# Patient Record
Sex: Female | Born: 1980 | Race: White | Hispanic: No | Marital: Married | Smoking: Never smoker
Health system: Southern US, Community
[De-identification: ages and names within clinical notes are randomized; demographics above are authoritative.]

## PROBLEM LIST (undated history)

## (undated) DIAGNOSIS — F329 Major depressive disorder, single episode, unspecified: Secondary | ICD-10-CM

## (undated) DIAGNOSIS — F419 Anxiety disorder, unspecified: Secondary | ICD-10-CM

## (undated) DIAGNOSIS — F32A Depression, unspecified: Secondary | ICD-10-CM

## (undated) DIAGNOSIS — Z5189 Encounter for other specified aftercare: Secondary | ICD-10-CM

## (undated) DIAGNOSIS — D649 Anemia, unspecified: Secondary | ICD-10-CM

## (undated) HISTORY — PX: NO PAST SURGERIES: SHX2092

---

## 2009-04-15 ENCOUNTER — Ambulatory Visit: Payer: Self-pay | Admitting: Obstetrics and Gynecology

## 2009-05-13 ENCOUNTER — Ambulatory Visit: Payer: Self-pay | Admitting: Physician Assistant

## 2009-06-03 ENCOUNTER — Ambulatory Visit (HOSPITAL_COMMUNITY): Admission: RE | Admit: 2009-06-03 | Discharge: 2009-06-03 | Payer: Self-pay | Admitting: Obstetrics & Gynecology

## 2009-06-10 ENCOUNTER — Ambulatory Visit: Payer: Self-pay | Admitting: Obstetrics and Gynecology

## 2009-07-01 ENCOUNTER — Ambulatory Visit: Payer: Self-pay | Admitting: Family

## 2009-07-19 ENCOUNTER — Ambulatory Visit: Payer: Self-pay | Admitting: Obstetrics & Gynecology

## 2009-08-11 ENCOUNTER — Ambulatory Visit (HOSPITAL_COMMUNITY): Admission: RE | Admit: 2009-08-11 | Discharge: 2009-08-11 | Payer: Self-pay | Admitting: Obstetrics & Gynecology

## 2009-08-12 ENCOUNTER — Ambulatory Visit: Payer: Self-pay | Admitting: Physician Assistant

## 2009-08-12 ENCOUNTER — Encounter: Payer: Self-pay | Admitting: Obstetrics and Gynecology

## 2009-08-12 LAB — CONVERTED CEMR LAB
HCT: 36.4 % (ref 36.0–46.0)
MCHC: 31.9 g/dL (ref 30.0–36.0)
Platelets: 129 10*3/uL — ABNORMAL LOW (ref 150–400)
RBC: 3.94 M/uL (ref 3.87–5.11)
RDW: 13.1 % (ref 11.5–15.5)
WBC: 7.9 10*3/uL (ref 4.0–10.5)

## 2009-08-15 ENCOUNTER — Encounter: Payer: Self-pay | Admitting: Obstetrics and Gynecology

## 2009-08-15 LAB — CONVERTED CEMR LAB
Albumin: 4 g/dL (ref 3.5–5.2)
Alkaline Phosphatase: 198 units/L — ABNORMAL HIGH (ref 39–117)
Sodium: 137 meq/L (ref 135–145)

## 2009-08-16 ENCOUNTER — Encounter: Payer: Self-pay | Admitting: Physician Assistant

## 2009-08-16 LAB — CONVERTED CEMR LAB
Creatinine 24 HR UR: 1422 mg/24hr (ref 700–1800)
Creatinine, Urine: 118.5 mg/dL

## 2009-08-29 ENCOUNTER — Ambulatory Visit: Payer: Self-pay | Admitting: Advanced Practice Midwife

## 2009-09-16 ENCOUNTER — Ambulatory Visit: Payer: Self-pay | Admitting: Obstetrics and Gynecology

## 2009-10-03 ENCOUNTER — Ambulatory Visit: Payer: Self-pay | Admitting: Physician Assistant

## 2009-10-04 ENCOUNTER — Encounter: Payer: Self-pay | Admitting: Physician Assistant

## 2009-10-04 LAB — CONVERTED CEMR LAB
Chlamydia, Swab/Urine, PCR: NEGATIVE
GC Probe Amp, Urine: NEGATIVE

## 2009-10-14 ENCOUNTER — Ambulatory Visit (HOSPITAL_COMMUNITY): Admission: RE | Admit: 2009-10-14 | Discharge: 2009-10-14 | Payer: Self-pay | Admitting: Family Medicine

## 2009-10-14 ENCOUNTER — Ambulatory Visit: Payer: Self-pay | Admitting: Obstetrics and Gynecology

## 2009-10-18 ENCOUNTER — Ambulatory Visit: Payer: Self-pay | Admitting: Obstetrics & Gynecology

## 2009-10-21 ENCOUNTER — Ambulatory Visit: Payer: Self-pay | Admitting: Obstetrics and Gynecology

## 2009-10-25 ENCOUNTER — Ambulatory Visit: Payer: Self-pay | Admitting: Obstetrics & Gynecology

## 2009-10-28 ENCOUNTER — Ambulatory Visit: Payer: Self-pay | Admitting: Obstetrics and Gynecology

## 2009-10-28 LAB — CONVERTED CEMR LAB
ALT: 27 units/L (ref 0–35)
AST: 33 units/L (ref 0–37)
Albumin: 3.4 g/dL — ABNORMAL LOW (ref 3.5–5.2)
Alkaline Phosphatase: 581 units/L — ABNORMAL HIGH (ref 39–117)
BUN: 9 mg/dL (ref 6–23)
CO2: 24 meq/L (ref 19–32)
Calcium: 8.6 mg/dL (ref 8.4–10.5)
Creatinine, Ser: 0.6 mg/dL (ref 0.40–1.20)
Glucose, Bld: 99 mg/dL (ref 70–99)
Hemoglobin: 12.9 g/dL (ref 12.0–15.0)
MCHC: 35.1 g/dL (ref 30.0–36.0)
RDW: 13.1 % (ref 11.5–15.5)
Sodium: 136 meq/L (ref 135–145)
Total Protein: 6.5 g/dL (ref 6.0–8.3)

## 2009-11-01 ENCOUNTER — Ambulatory Visit: Payer: Self-pay | Admitting: Advanced Practice Midwife

## 2009-11-01 ENCOUNTER — Inpatient Hospital Stay (HOSPITAL_COMMUNITY): Admission: AD | Admit: 2009-11-01 | Discharge: 2009-11-04 | Payer: Self-pay | Admitting: Obstetrics & Gynecology

## 2009-11-05 ENCOUNTER — Inpatient Hospital Stay (HOSPITAL_COMMUNITY): Admission: AD | Admit: 2009-11-05 | Discharge: 2009-11-05 | Payer: Self-pay | Admitting: Obstetrics & Gynecology

## 2009-11-05 ENCOUNTER — Ambulatory Visit: Payer: Self-pay | Admitting: Advanced Practice Midwife

## 2009-11-05 ENCOUNTER — Encounter: Admission: RE | Admit: 2009-11-05 | Discharge: 2009-12-05 | Payer: Self-pay | Admitting: Family Medicine

## 2009-11-11 ENCOUNTER — Ambulatory Visit: Payer: Self-pay | Admitting: Physician Assistant

## 2009-11-11 LAB — CONVERTED CEMR LAB: RDW: 14.5 % (ref 11.5–15.5)

## 2009-11-14 ENCOUNTER — Ambulatory Visit: Admission: RE | Admit: 2009-11-14 | Discharge: 2009-11-14 | Payer: Self-pay | Admitting: Family Medicine

## 2009-11-17 ENCOUNTER — Ambulatory Visit: Admission: RE | Admit: 2009-11-17 | Discharge: 2009-11-17 | Payer: Self-pay | Admitting: Family Medicine

## 2009-12-09 ENCOUNTER — Ambulatory Visit: Payer: Self-pay | Admitting: Obstetrics and Gynecology

## 2010-01-03 ENCOUNTER — Encounter: Admission: RE | Admit: 2010-01-03 | Discharge: 2010-02-02 | Payer: Self-pay | Admitting: Family Medicine

## 2010-01-17 ENCOUNTER — Ambulatory Visit: Payer: Self-pay | Admitting: Obstetrics & Gynecology

## 2010-01-18 ENCOUNTER — Encounter: Payer: Self-pay | Admitting: Obstetrics & Gynecology

## 2010-01-18 LAB — CONVERTED CEMR LAB
Chlamydia, DNA Probe: NEGATIVE
Clue Cells Wet Prep HPF POC: NONE SEEN
TSH: 1.496 microintl units/mL (ref 0.350–4.500)

## 2010-01-23 ENCOUNTER — Encounter: Admission: RE | Admit: 2010-01-23 | Discharge: 2010-01-23 | Payer: Self-pay | Admitting: Obstetrics & Gynecology

## 2010-01-25 ENCOUNTER — Ambulatory Visit: Payer: Self-pay | Admitting: Obstetrics & Gynecology

## 2010-02-03 ENCOUNTER — Encounter: Admission: RE | Admit: 2010-02-03 | Discharge: 2010-03-05 | Payer: Self-pay | Admitting: Family Medicine

## 2010-03-06 ENCOUNTER — Encounter: Admission: RE | Admit: 2010-03-06 | Discharge: 2010-04-05 | Payer: Self-pay | Admitting: Family Medicine

## 2010-03-24 ENCOUNTER — Ambulatory Visit: Payer: Self-pay | Admitting: Obstetrics and Gynecology

## 2010-04-06 ENCOUNTER — Encounter: Admission: RE | Admit: 2010-04-06 | Discharge: 2010-05-06 | Payer: Self-pay | Admitting: Family Medicine

## 2010-05-07 ENCOUNTER — Encounter
Admission: RE | Admit: 2010-05-07 | Discharge: 2010-06-06 | Payer: Self-pay | Source: Home / Self Care | Admitting: Family Medicine

## 2010-06-07 ENCOUNTER — Encounter: Admission: RE | Admit: 2010-06-07 | Discharge: 2010-06-20 | Payer: Self-pay | Admitting: Family Medicine

## 2010-07-08 ENCOUNTER — Encounter: Admission: RE | Admit: 2010-07-08 | Discharge: 2010-08-07 | Payer: Self-pay | Admitting: Family Medicine

## 2010-08-08 ENCOUNTER — Encounter
Admission: RE | Admit: 2010-08-08 | Discharge: 2010-09-07 | Payer: Self-pay | Source: Home / Self Care | Attending: Family Medicine | Admitting: Family Medicine

## 2010-09-08 ENCOUNTER — Encounter
Admission: RE | Admit: 2010-09-08 | Discharge: 2010-10-08 | Payer: Self-pay | Source: Home / Self Care | Attending: Family Medicine | Admitting: Family Medicine

## 2010-10-09 ENCOUNTER — Encounter
Admission: RE | Admit: 2010-10-09 | Discharge: 2010-10-31 | Payer: Self-pay | Source: Home / Self Care | Attending: Family Medicine | Admitting: Family Medicine

## 2010-10-22 ENCOUNTER — Encounter: Payer: Self-pay | Admitting: Obstetrics & Gynecology

## 2010-11-09 ENCOUNTER — Encounter (HOSPITAL_COMMUNITY)
Admission: RE | Admit: 2010-11-09 | Discharge: 2010-11-09 | Disposition: A | Discharge status: Home / Self Care | Payer: Self-pay | Source: Ambulatory Visit | Attending: Family Medicine | Admitting: Family Medicine

## 2010-11-09 DIAGNOSIS — O923 Agalactia: Secondary | ICD-10-CM | POA: Insufficient documentation

## 2010-12-08 ENCOUNTER — Encounter (HOSPITAL_COMMUNITY): Payer: Self-pay

## 2010-12-10 ENCOUNTER — Encounter (HOSPITAL_COMMUNITY)
Admission: RE | Admit: 2010-12-10 | Discharge: 2010-12-10 | Disposition: A | Discharge status: Home / Self Care | Payer: Self-pay | Source: Ambulatory Visit | Attending: Family Medicine | Admitting: Family Medicine

## 2010-12-10 DIAGNOSIS — O923 Agalactia: Secondary | ICD-10-CM | POA: Insufficient documentation

## 2010-12-13 IMAGING — US US OB DETAIL+14 WK
2 series · 14 of 28 positions shown · non-contrast
Comparison: none

OBSTETRICAL ULTRASOUND:
 This ultrasound exam was performed in the [HOSPITAL] Ultrasound Department.  The OB US report was generated in the AS system, and faxed to the ordering physician.  This report is also available in [REDACTED] PACS.

[Series 1: us ob detail +14 wk · 0.21mm/px · 13 of 104 slices shown (1 of 2)]
[im 4/104]
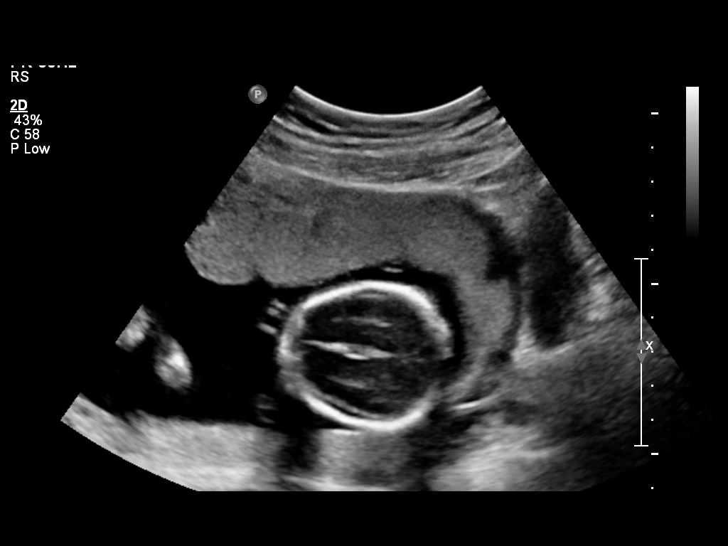
[im 12/104]
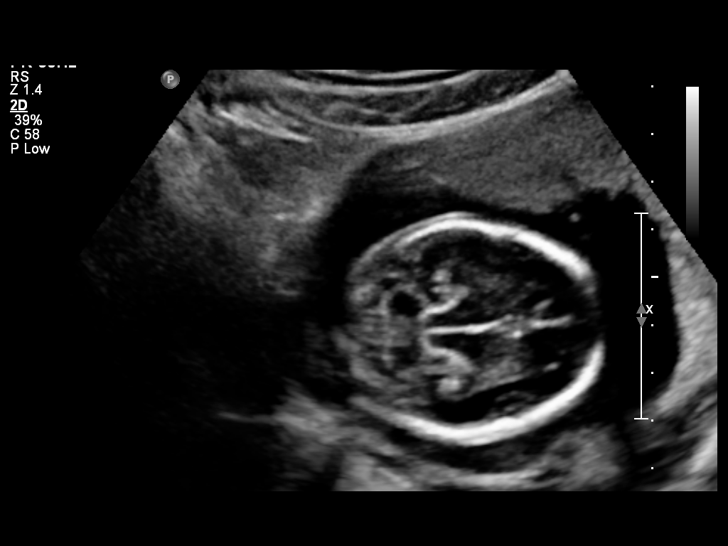
[im 20/104]
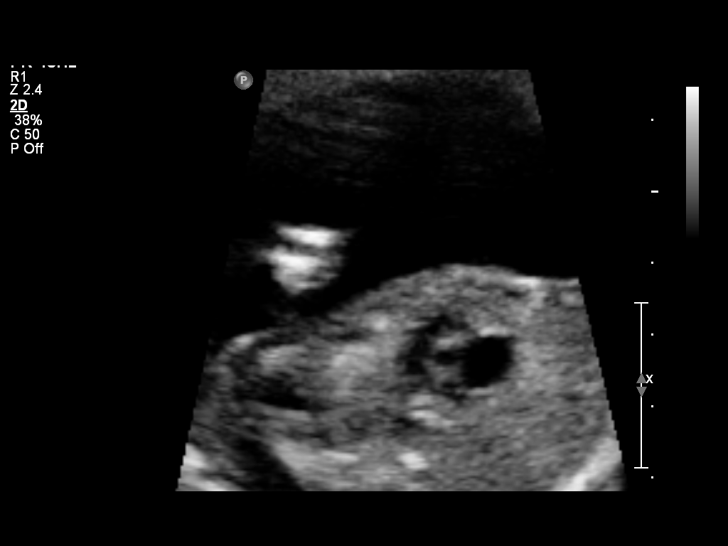
[im 28/104]
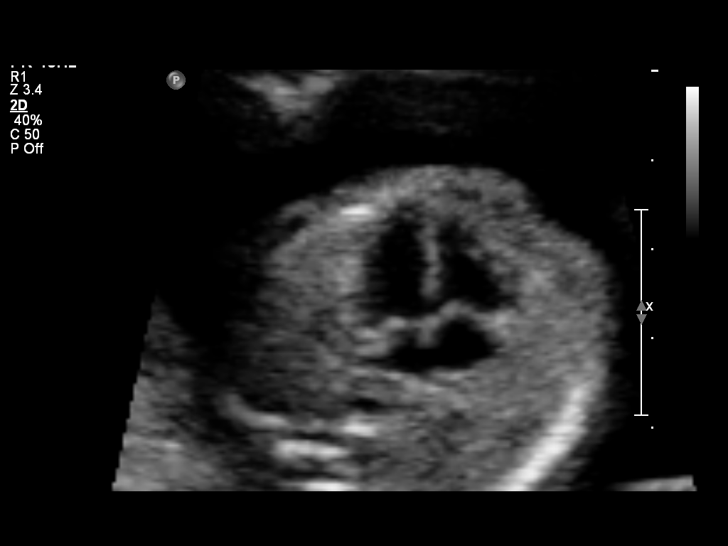
[im 36/104]
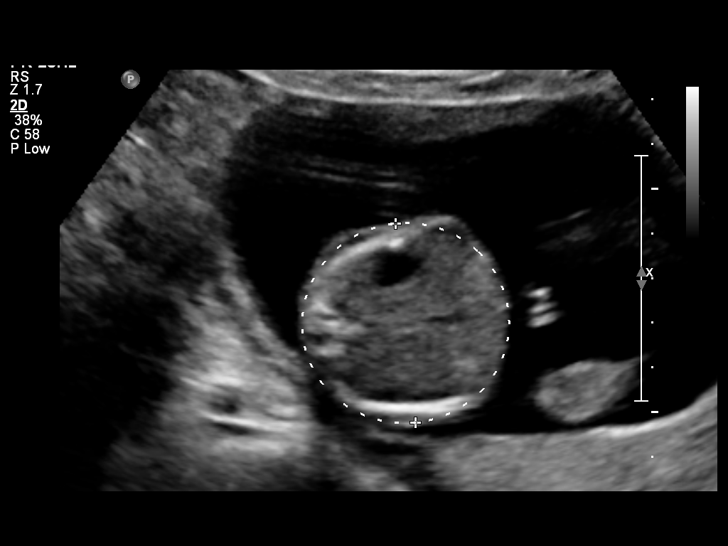
[im 44/104]
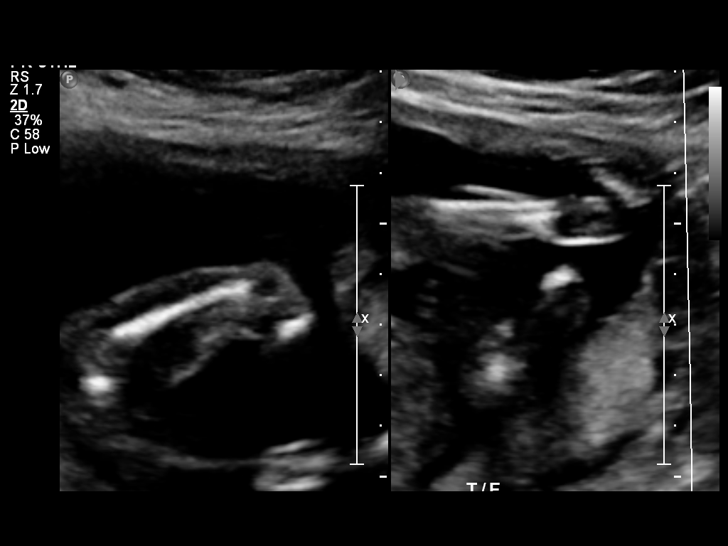
[im 52/104]
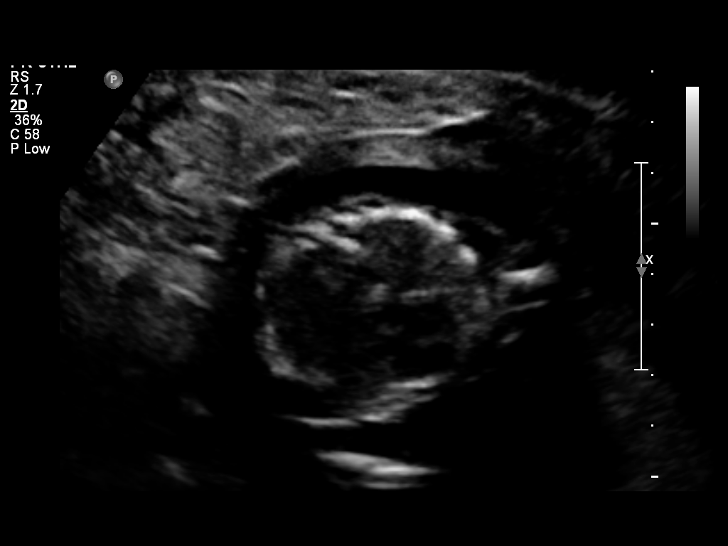
[im 60/104]
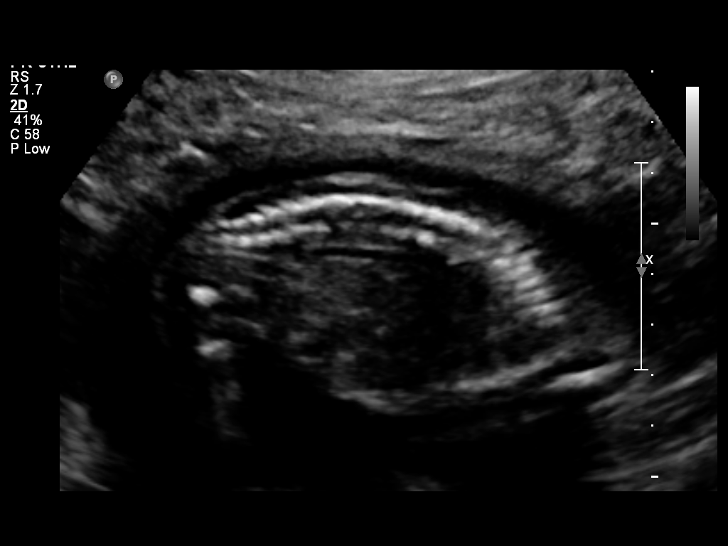
[im 68/104]
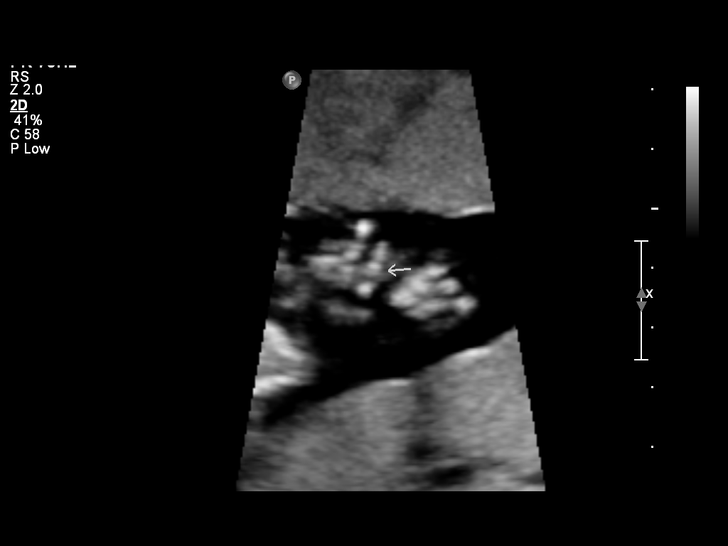
[im 76/104]
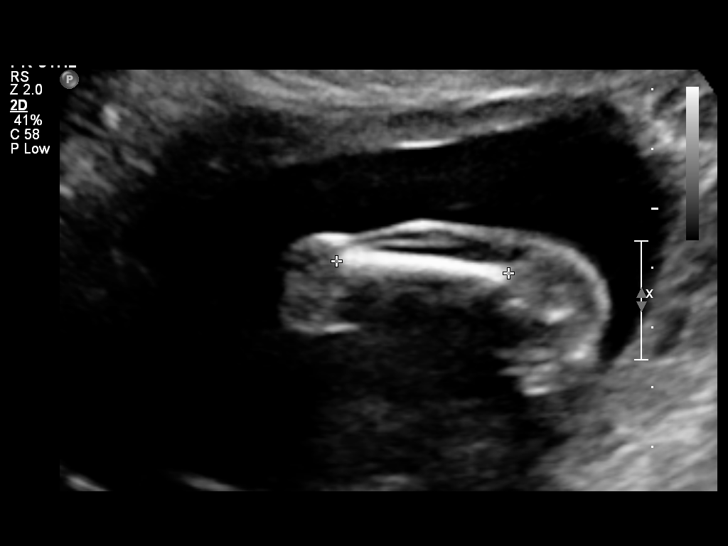
[im 84/104]
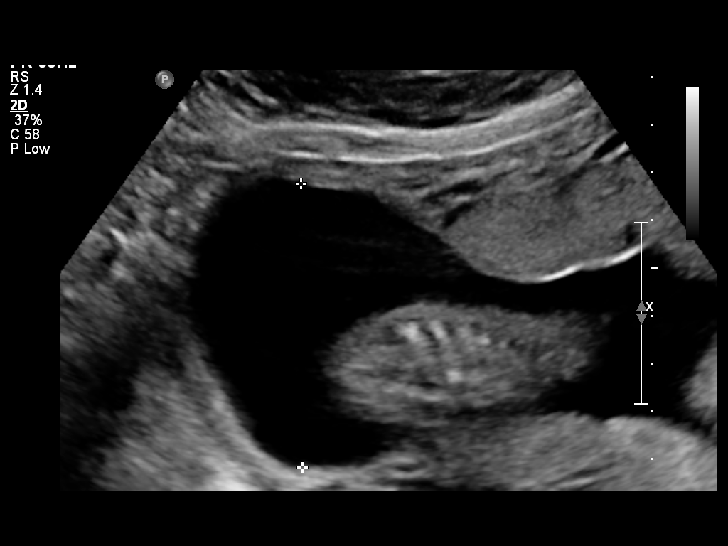
[im 92/104]
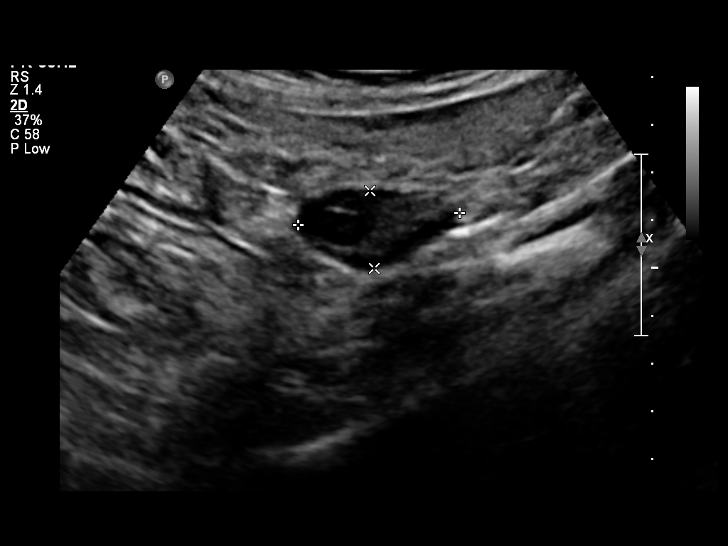
[im 100/104]
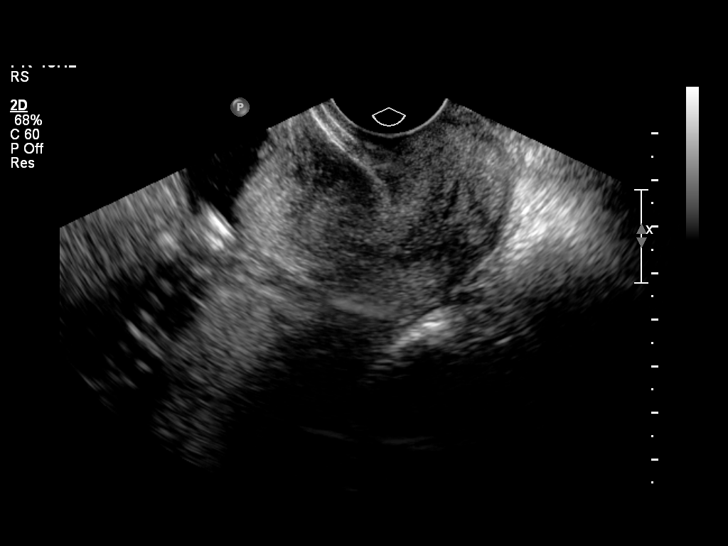

[Series 1: us ob detail +14 wk · 0.19mm/px · 1 of 1 slices shown (2 of 2)]
[im 1/1]
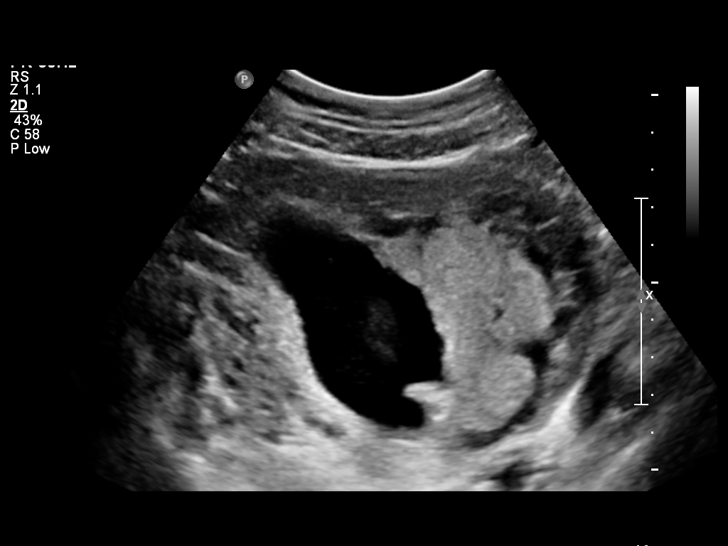

[14 of 28 positions shown; findings below may reference images not displayed]

IMPRESSION: See AS Obstetric US report.

## 2010-12-20 LAB — CROSSMATCH
ABO/RH(D): AB POS
Antibody Screen: NEGATIVE

## 2010-12-20 LAB — CBC
HCT: 21.8 % — ABNORMAL LOW (ref 36.0–46.0)
HCT: 23.5 % — ABNORMAL LOW (ref 36.0–46.0)
Hemoglobin: 12 g/dL (ref 12.0–15.0)
Hemoglobin: 7.3 g/dL — ABNORMAL LOW (ref 12.0–15.0)
Hemoglobin: 8.6 g/dL — ABNORMAL LOW (ref 12.0–15.0)
MCHC: 33.5 g/dL (ref 30.0–36.0)
MCHC: 33.5 g/dL (ref 30.0–36.0)
MCHC: 33.8 g/dL (ref 30.0–36.0)
MCV: 90.5 fL (ref 78.0–100.0)
MCV: 91.8 fL (ref 78.0–100.0)
MCV: 92.9 fL (ref 78.0–100.0)
Platelets: 55 10*3/uL — ABNORMAL LOW (ref 150–400)
Platelets: 68 10*3/uL — ABNORMAL LOW (ref 150–400)
RBC: 2.6 MIL/uL — ABNORMAL LOW (ref 3.87–5.11)
RBC: 3.89 MIL/uL (ref 3.87–5.11)
RDW: 13.7 % (ref 11.5–15.5)
RDW: 13.8 % (ref 11.5–15.5)
RDW: 14.6 % (ref 11.5–15.5)
WBC: 11.2 10*3/uL — ABNORMAL HIGH (ref 4.0–10.5)
WBC: 22.7 10*3/uL — ABNORMAL HIGH (ref 4.0–10.5)
WBC: 7.4 10*3/uL (ref 4.0–10.5)
WBC: 8.2 10*3/uL (ref 4.0–10.5)
WBC: 9.7 10*3/uL (ref 4.0–10.5)

## 2010-12-20 LAB — ABO/RH: ABO/RH(D): AB POS

## 2010-12-20 LAB — MRSA CULTURE

## 2010-12-20 LAB — PREPARE RBC (CROSSMATCH)

## 2011-01-10 ENCOUNTER — Encounter (HOSPITAL_COMMUNITY)
Admission: RE | Admit: 2011-01-10 | Discharge: 2011-01-10 | Disposition: A | Discharge status: Home / Self Care | Payer: Self-pay | Source: Ambulatory Visit | Attending: Family Medicine | Admitting: Family Medicine

## 2011-01-10 DIAGNOSIS — O923 Agalactia: Secondary | ICD-10-CM | POA: Insufficient documentation

## 2011-02-13 NOTE — Assessment & Plan Note (Signed)
NAME:  Allison Garrett, Allison Garrett               ACCOUNT NO.:  [id number]   MEDICAL RECORD NO.:  [id number]          PATIENT TYPE:  POB   LOCATION:  [facility] at [place]         FACILITY:  [facility]   PHYSICIAN:  Maylon Cos, CNM    DATE OF BIRTH:  06-01-1981   DATE OF SERVICE:                                  CLINIC NOTE   REASON FOR TODAY'S VISIT:  One-week postpartum check.   HISTORY OF PRESENT ILLNESS:  The patient is 1-week status post  spontaneous vaginal delivery at [facility] of a female infant  weighing 7 pounds and 13 ounces.  She was delivered by Wynelle Bourgeois.  Her postpartum course was complicated by a significant postpartum  hemorrhage that did require manual expiration of the uterus x2 and blood  transfusion for acute blood loss.  The patient presents today with no  complaints.  She states that she is feeling well.  She is currently  taking iron twice a day along with her prenatal vitamin.  She is  sparingly using Percocet as needed for pain and she is using a stool  softener daily.  Her biggest concern today is her fear of this happening  again in a subsequent burst.  She is tearful in discussions of her  postpartum course.  She is very happy with her prenatal course and her  delivery.  However, she has multiple questions concerning why she had a  postpartum hemorrhage and whether or not this would predispose her for  happening again.   PHYSICAL EXAMINATION:  GENERAL:  Today, Allison Garrett is a pleasant 30-year-  old Caucasian female, who appears to be younger than her stated age.  She is pale in no acute distress.  VITAL SIGNS:  Her vital signs are stable.  Her pulse is 82.  Her blood  pressure is 130/88.  Her weight is 144 and she is 62 inches.  HEENT:  Grossly normal.  HEART:  Regular rate and rhythm.  LUNGS:  Clear to auscultation bilaterally.  ABDOMEN:  Uterus is firm.  Lochia is scant.  Drainage without odor.  Perineum is healing well from the repair.  A small amount of  discharge  was obtained for wet prep.  At the patient's request, wet prep revealed  normal epithelial cells with no signs of white blood cells.  There were  red blood cells.  No infection indicated.   ASSESSMENT:  1. The patient is 1 week status post spontaneous vaginal delivery of a      female infant.  2. Status post postpartum hemorrhage and blood transfusion.  3. Breast-feeding.  4. Postpartum blues.   PLAN:  1. We discussed at length today normal emotions and postpartum blues      course and appropriate grieving of her perfect delivery and      postpartum course.  We have discussed at length postpartum      hemorrhage does increase her risk for a subsequent postpartum      hemorrhage.  However, given that this hemorrhage was related to      some retention of membranes that we will be cautious of prophylaxis      after delivery of a next infant.  She is questioning whether or not      she would like to have a primary cesarean section with her      subsequent pregnancy and delivery due to these complications and we      have discussed the risk and benefits of both and at this time,      there is no need to make a decision on that.  However, it is not      our recommendation to have a primary elective C-section in future      pregnancies secondary to a history of a postpartum hemorrhage.  2. We will recheck a CBC today.  3. Refer for counseling due to postpartum blues and risk for      postpartum depression.  4. The patient should follow up in 4-5 weeks for her routine      postpartum exam or p.r.n. problems.           ______________________________  Maylon Cos, CNM     SS/MEDQ  D:  11/25/2009  T:  11/25/2009  Job:  [id number]

## 2011-02-13 NOTE — Assessment & Plan Note (Signed)
NAME:  Allison, Garrett               ACCOUNT NO.:  [id number]   MEDICAL RECORD NO.:  [id number]          PATIENT TYPE:  POB   LOCATION:  [facility] at [place]         FACILITY:  [facility]   PHYSICIAN:  Allie Bossier, MD        DATE OF BIRTH:  12-Sep-1981   DATE OF SERVICE:                                  CLINIC NOTE   Allison Garrett is a 30 year old lady who has given birth to a daughter about 11  weeks ago.  She was seen for her postpartum exam by Deirdre about 5  weeks ago.  At that time she reported that her lochia had stopped.  Deirdre reassured her about helping to downplay her worries about future  postpartum hemorrhages and today she comes in complaining that she has  had some irregular bleeding/bloody mucusy discharge since her visit with  Deirdre.  She is breastfeeding exclusively.  She says she is using  condoms for birth control, but she has only had sex twice since delivery  due to the bleeding.   On exam, she has a small amount of bloody discharges, absolutely no  other discharge noted, although I did send a wet prep and cervical  cultures.  Her external genitalia is within normal limits.   ASSESSMENT AND PLAN:  Bloody vaginal discharge at 10 weeks postpartum in  a breastfeeding mom.  I felt this was entirely within normal limits, but  I have sent cervical cultures along with a wet prep.  I am checking her  TSH and schedule a GYN ultrasound.  Of note on exam she is relatively  anxious and she says that she quit taking her Lexapro at the time of  pregnancy.  She declines taking any other antidepressants at this time  and says she is just going to work throughout herself.  She also  declines alternative forms of birth control than using condoms.      Allie Bossier, MD     MCD/MEDQ  D:  01/17/2010  T:  01/18/2010  Job:  [id number]

## 2011-02-13 NOTE — Assessment & Plan Note (Signed)
NAME:  [name], [name]               ACCOUNT NO.:  [id number]   MEDICAL RECORD NO.:  [id number]          PATIENT TYPE:  POB   LOCATION:  [facility] at [place]         FACILITY:  [facility]   PHYSICIAN:  Allie Bossier, MD        DATE OF BIRTH:  03-11-1981   DATE OF SERVICE:  01/25/2010                                  CLINIC NOTE   Allison Garrett is a 30 year old woman who has a 59-week-old daughter, status  post NSVD.  When I saw her on January 17, 2010, she was complaining of  some bloody/mucousy discharge since her postpartum visit at 6 weeks.  She is breastfeeding exclusively.  At that time, she was very anxious  about this issue even though I told her this is probably normal.  Therefore, I did a GYN ultrasound, a TSH, a wet prep, and cervical  cultures.  All the labs were normal.  The ultrasound showed a 7-mm focal  mass, light echogenic area.  I have offered her a D and C/hysteroscopy  versus watchful waiting.  Her mother was involved in this discussion as  well.  Since she has not had any further bleeding since her last visit  she prefers to proceed with watchful waiting instead of surgery, this is  fine with me as I think she is perfectly normal.  I told her to come  back in a year or sooner as necessary.      Allie Bossier, MD     MCD/MEDQ  D:  01/25/2010  T:  01/25/2010  Job:  [id number]

## 2011-02-13 NOTE — Assessment & Plan Note (Signed)
NAME:  KAYLIEGH, BOYERS               ACCOUNT NO.:  [id number]   MEDICAL RECORD NO.:  [id number]           PATIENT TYPE:   LOCATION:  [facility] at [place]           FACILITY:   PHYSICIAN:  Deirdre Christy Gentles, CNM       DATE OF BIRTH:  07-29-1981   DATE OF SERVICE:  02/03/2010                                  CLINIC NOTE   I spoke with Jing today regarding her return to work.  It is noted  that at her 1-week postpartum visit November 11, 2009, we had  recommended that she take 12 weeks off due to postpartum blues and the  fact that she underwent a traumatic delivery experience that has  continued to be emotionally taxing to her.  At her 6-week check, she was  still experiencing anxiety and processing the events that led to her  staying in the intensive care unit, but she was physically doing well at  that time.  However, she was seen again on January 17, 2010, with some  abnormal bleeding and again anxiety.  We offered her antidepressants  during the postpartum period, but she declined that due to concerns  about medications being excreted through breast milk.  She elected  instead to work through her emotions herself.  She underwent an  ultrasound on January 23, 2010, evaluating her bleeding and there was  noted to be a very small area of a mass which could possibly be a polyp  and less likely could be retained products, but at that size did not  warrant further evaluation unless she continued to have abnormal  bleeding.  Dr. Marice Potter offered her a D and C hysteroscopy versus watchful  waiting and they decided to wait.  In light of all of this, it seems  appropriate for her to be out of work during this extended postpartum  period.           ______________________________  Caren Griffins, CNM     DP/MEDQ  D:  02/03/2010  T:  02/03/2010  Job:  [id number]

## 2011-02-13 NOTE — Assessment & Plan Note (Signed)
NAME:  Allison Garrett, Allison Garrett               ACCOUNT NO.:  [id number]   MEDICAL RECORD NO.:  [id number]          PATIENT TYPE:  POB   LOCATION:  [facility] at [place]         FACILITY:  [facility]   PHYSICIAN:  Caren Griffins, CNM       DATE OF BIRTH:  1981-08-10   DATE OF SERVICE:  12/09/2009                                  CLINIC NOTE   [name] is 5-6 weeks postpartum and doing very well.  The baby is  thriving and she is breastfeeding without supplementation.  She has lots  of family support and has not resumed working.  She continues to take  her prenatal vitamin and 1 iron pill a day.  Her lochia stopped.  She  has not resumed intercourse and has no pain, but has a little area in  the vagina that she feels is irritated.  Bowel and bladder function are  normal.  She is planning to continue condoms only and is aware of the  use effectiveness rate and that spermicidal condoms may be better.   PHYSICAL EXAMINATION:  GENERAL:  WN, WD pleasant in NAD  VITAL SIGNS:  BP 116/78, weight 142.  HEART:  RRR.  LUNGS:  CTA bilateral.  ABDOMEN:  Soft, nontender.  PELVIC:  NEFG.  Episiotomy is well healed.  Perineally proximal to  hymenal ring, there is a 1-cm erythematous area but this is not friable  and there is no blood.  Cervix is parous, posterior.  No lesions.  Pap  smear is done.  Discharge is very minimal.   ASSESSMENT:  G1, P1 doing well postpartum.   PLAN:  She is going to continue on prenatal vitamin and iron as long as  she is breastfeeding.  We had a lengthy discussion about all aspects of  her induction process and particularly her feelings concerning the  severe postpartum hemorrhage and stay in the ICU.  She seems to doing  well with that and we spent a lot of time talking about everyone's  perspective including her family members perspectives on what was  happening and tried to emphasize with her the good long-term outcome for  both her and the baby and downplay her worries about any  future  postpartum hemorrhages.  Followup will be pending her Pap smear results.           ______________________________  Caren Griffins, CNM     DP/MEDQ  D:  12/09/2009  T:  12/10/2009  Job:  [id number]

## 2011-02-13 NOTE — Assessment & Plan Note (Signed)
NAME:  Allison Garrett, Allison Garrett               ACCOUNT NO.:  [id number]   MEDICAL RECORD NO.:  [id number]          PATIENT TYPE:  POB   LOCATION:  [facility] at [place]         FACILITY:  [facility]   PHYSICIAN:  Caren Griffins, CNM       DATE OF BIRTH:  04/30/1981   DATE OF SERVICE:  03/24/2010                                  CLINIC NOTE   REASON FOR VISIT:  Anxiety and requesting diaphragm.   HISTORY:  Allison Garrett is doing well now about 4 months postpartum and still  breast-feeding without supplementation.  She is working part-time at  home and has childcare help.  She feels that she has not been able to  control her free-floating anxiety.  She has been especially anxious  whenever there is a storm.  This is similar to her longstanding anxiety  from which she got relief using Lexapro in the past.  However, now she  is very concerned about antidepressant meds excreted through breast  milk.  She has been doing some counseling with a certified counselor at  her church and has another appointment in 2 weeks.  She feels it is  somewhat helpful and less expensive than the psychologist she is gone to  in the past.   She had 1 day of bleeding.  No real menses since delivery.  She is  having intercourse using condoms and has no pain or problems.  She would  like to try a diaphragm with spermicide and she understands all the  contraceptive options at her disposal.   OBJECTIVE:  VITAL SIGNS:  Pulse 103, BP 125/82, weight 127.  GENERAL:  WN, WD, and NAD, pleasant and appropriate affect.  EXAM:  NEFG.   Diaphragm fitting done.  My feeling was that she could probably use 7.0,  but this was too uncomfortable for her.  She felt much more comfortable  with the 6.5 and on checking placement, there is very little mobility  and the cervix was behind the dome.  She was able to insert and remove  the device without difficulty.   IMPRESSION AND PLAN:  Anxiety.  For this, we will go ahead and start her  on Zoloft 25 mg  one a day and she may increase herself to 50 mg one a  day after 1-2 weeks if she feels that she needs that much.  I did advise  her that this may be her best option since it is excreted less in breast  milk than other selective serotonin reuptake inhibitors; however, if it  does not work, we may need to think about Lexapro which helped her in  the past.  She is also encouraged to continue her counseling and to  continue child care as much as possible to spend time doing things that  she likes to do.  She will call us in a couple of weeks to let us know  how she is doing with this and if she has problems with the diaphragm  particularly a lot of mobility during intercourse or if not staying in  place correctly and she should come back or we should have just  prescribe the next size up, which will be  a 7.0.          ______________________________  Caren Griffins, CNM    DP/MEDQ  D:  03/24/2010  T:  03/25/2010  Job:  [id number]

## 2011-04-25 IMAGING — US US FETAL BPP W/O NONSTRESS
1 series · 14 of 28 positions shown · non-contrast
Comparison: none

OBSTETRICAL ULTRASOUND:
 This ultrasound exam was performed in the [HOSPITAL] Ultrasound Department.  The OB US report was generated in the AS system, and faxed to the ordering physician.  This report is also available in [HOSPITAL]?s Access[employer] and in [REDACTED] PACS.

[Series 1: us fetal bpp w/o nonstress · non-contrast · 0.36mm/px · 28 acquisitions, 14 frames shown]
[im 2/28]
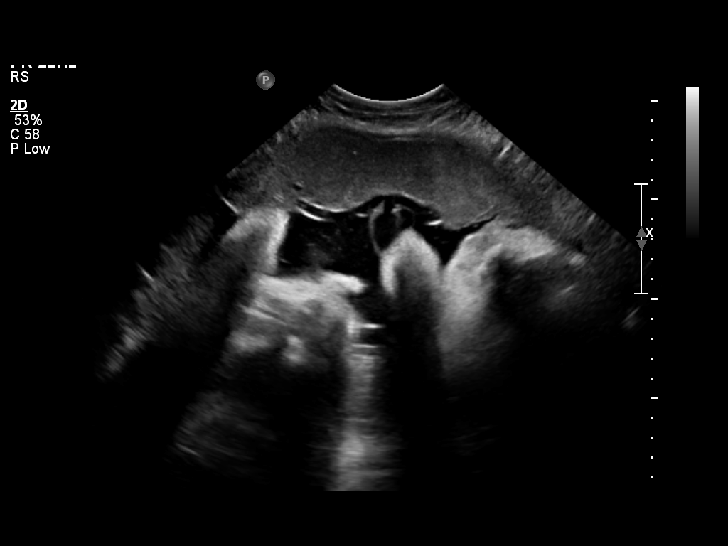
[im 4/28]
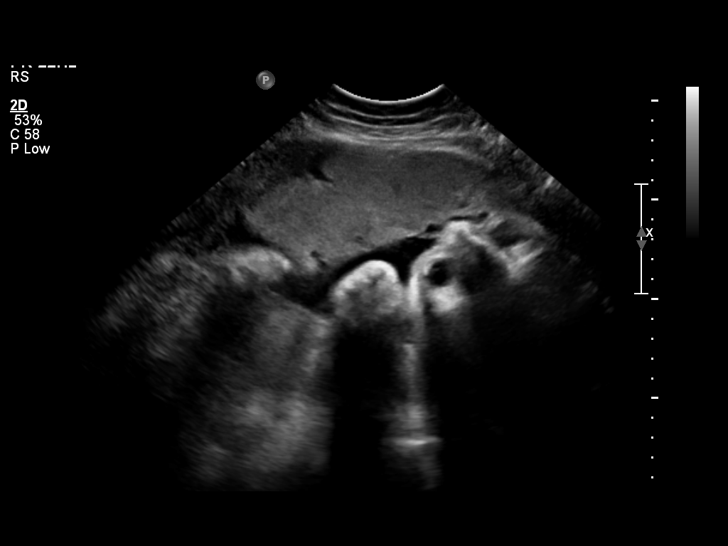
[im 6/28]
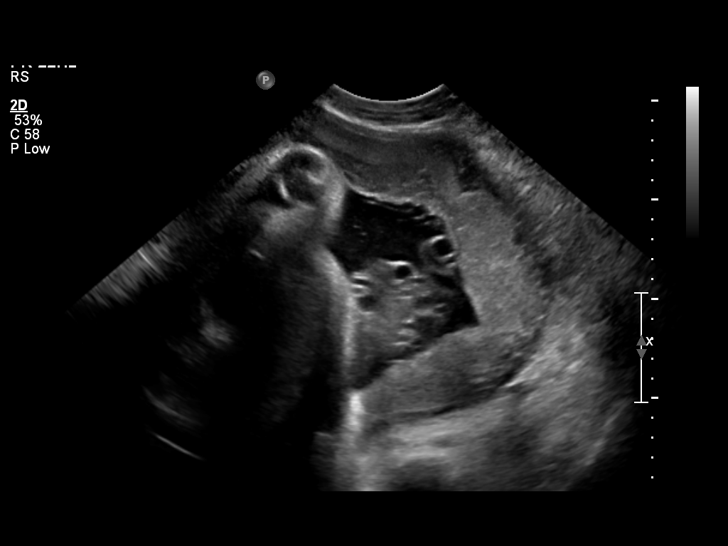
[im 8/28]
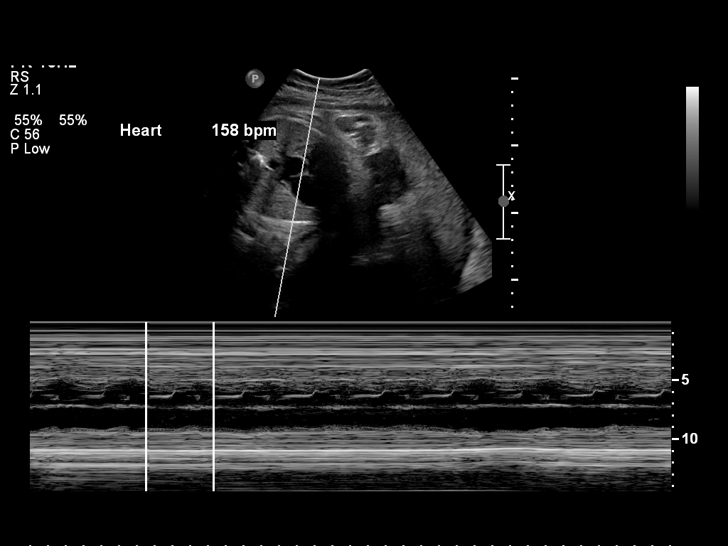
[im 10/28]
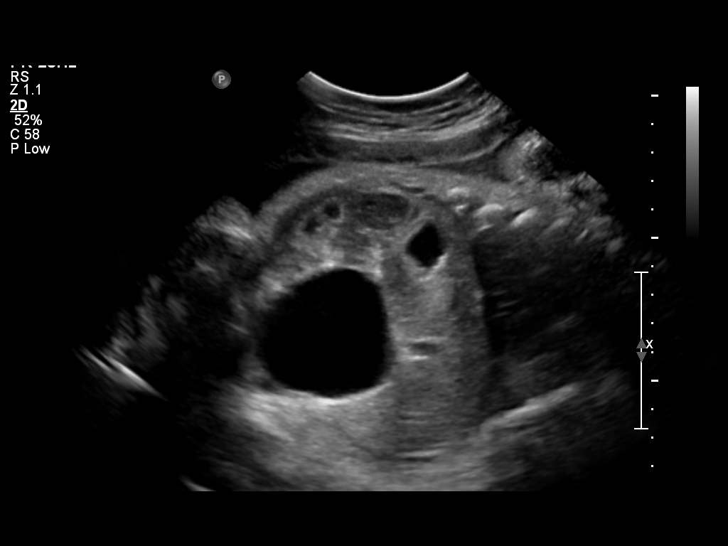
[im 12/28]
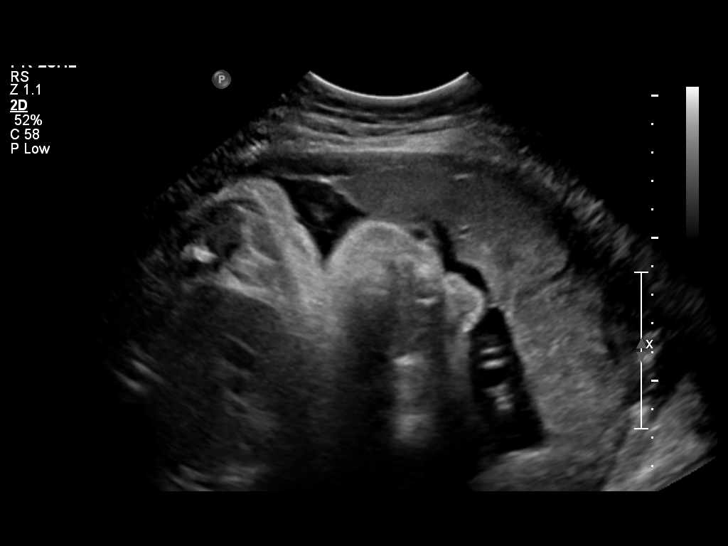
[im 14/28]
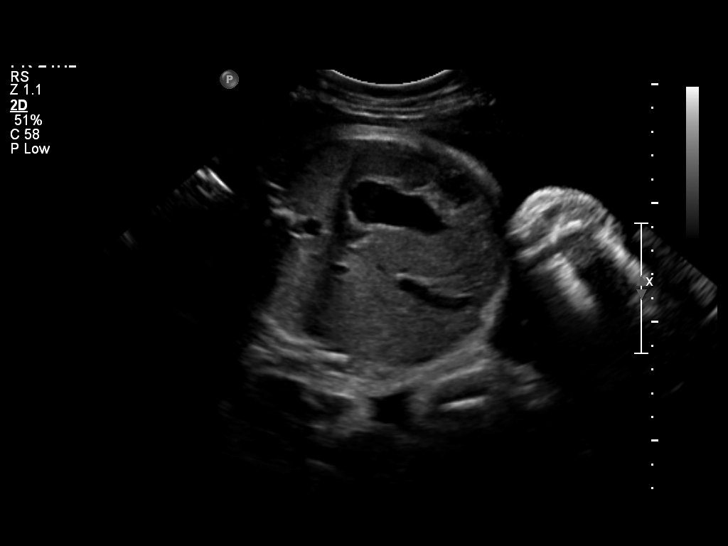
[im 16/28]
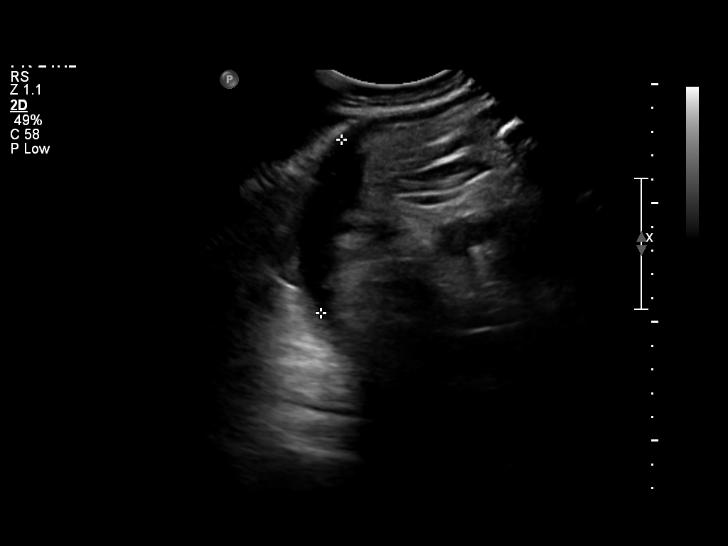
[im 18/28]
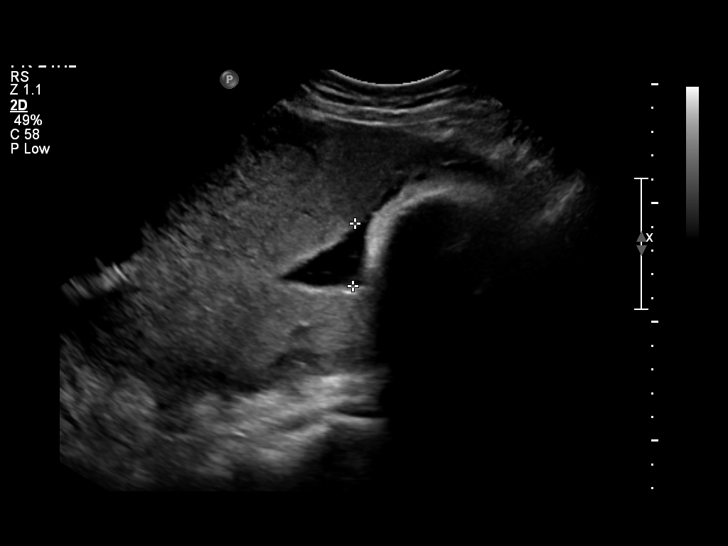
[im 20/28]
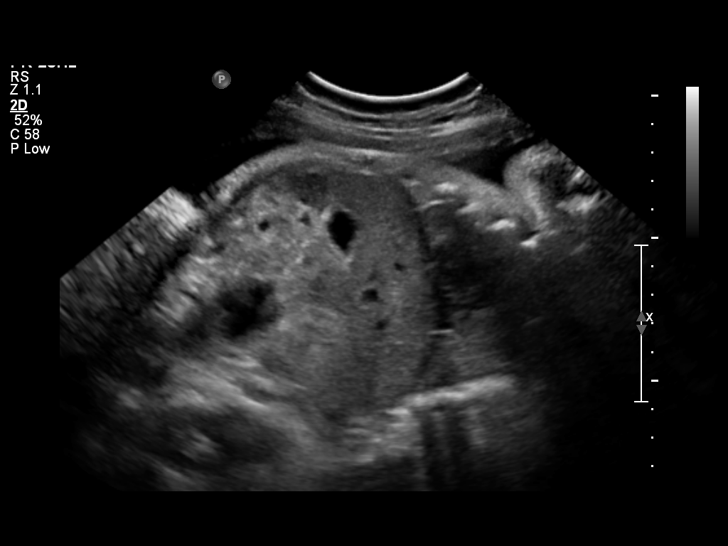
[im 22/28]
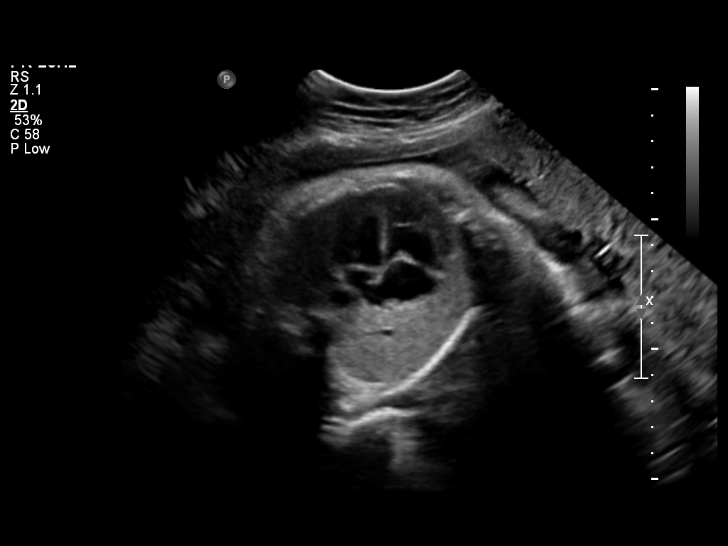
[im 24/28]
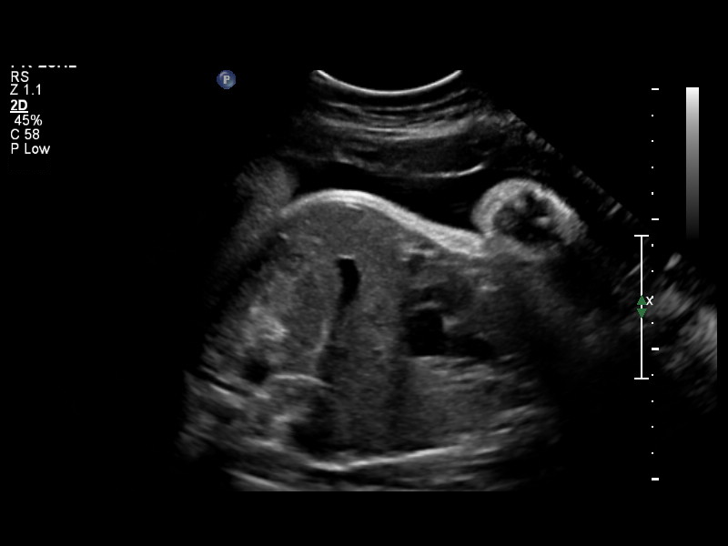
[im 26/28]
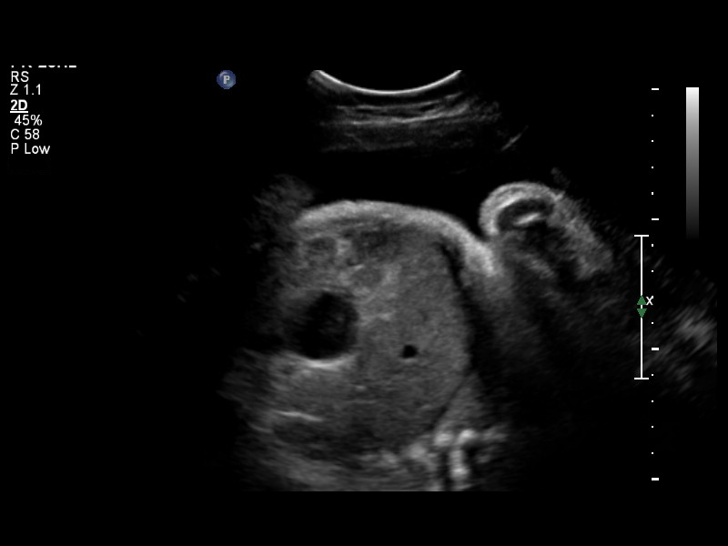
[im 28/28]
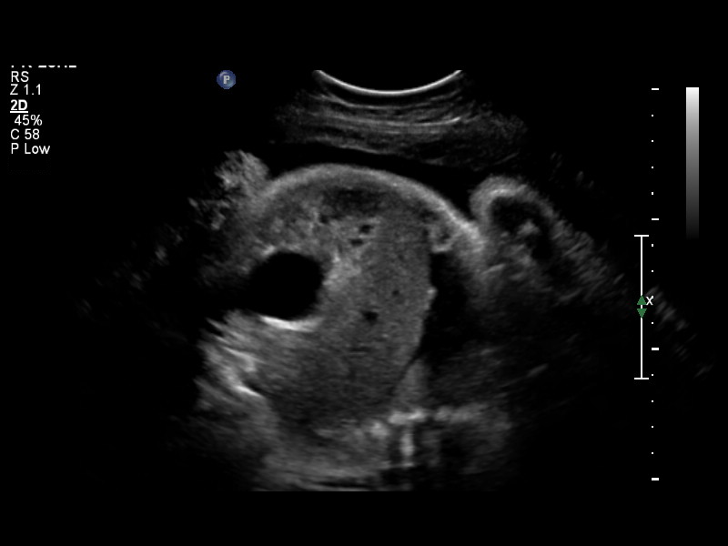

[14 of 28 positions shown; findings below may reference images not displayed]

IMPRESSION: See AS Obstetric US report.

## 2012-08-05 LAB — OB RESULTS CONSOLE PLATELET COUNT: PLATELETS: 116 10*3/uL

## 2012-08-05 LAB — OB RESULTS CONSOLE TSH: TSH: 1.454

## 2012-11-01 ENCOUNTER — Other Ambulatory Visit: Payer: Self-pay | Admitting: Family

## 2013-03-24 LAB — OB RESULTS CONSOLE ABO/RH: RH TYPE: POSITIVE

## 2013-03-24 LAB — OB RESULTS CONSOLE RUBELLA ANTIBODY, IGM: RUBELLA: IMMUNE

## 2013-03-24 LAB — OB RESULTS CONSOLE HEPATITIS B SURFACE ANTIGEN: Hepatitis B Surface Ag: NEGATIVE

## 2013-03-24 LAB — OB RESULTS CONSOLE GC/CHLAMYDIA
CHLAMYDIA, DNA PROBE: NEGATIVE
Gonorrhea: NEGATIVE

## 2013-03-24 LAB — OB RESULTS CONSOLE ANTIBODY SCREEN: Antibody Screen: NEGATIVE

## 2013-03-24 LAB — OB RESULTS CONSOLE PLATELET COUNT: Platelets: 164 10*3/uL

## 2013-03-24 LAB — OB RESULTS CONSOLE HGB/HCT, BLOOD: HEMOGLOBIN: 11.8 g/dL

## 2013-03-24 LAB — OB RESULTS CONSOLE RPR: RPR: NONREACTIVE

## 2013-03-24 LAB — OB RESULTS CONSOLE HIV ANTIBODY (ROUTINE TESTING): HIV: NONREACTIVE

## 2013-09-28 LAB — OB RESULTS CONSOLE GBS: GBS: POSITIVE

## 2013-10-01 NOTE — L&D Delivery Note (Signed)
Delivery Note  Pt rapidly progressed to complete, in tub, using various positions, FHR remained reassuring on intermittent EFM, just prior to delivery VE noted to have BBOW, over the next few ctx, vtx began to crown, unsure of exact ROM time, but no evidence of meconium in water.   At 4:23 AM a viable female was delivered via Vaginal, Spontaneous Delivery (Presentation: Right Occiput Anterior). Delivered in water and infant brought to surface to mom's chest, was slow to cry but was alert w good heart rate, and good tone.  APGAR: 7, 9; weight 8 lb 7.6 oz (3844 g).   Placenta status: Intact, Spontaneous, trailing membranes teased out, Cord: 3 vessels with the following complications: None.  Cord pH: n/a  Anesthesia: Local  Episiotomy: None Lacerations: 1st degree Suture Repair: 3.0 vicryl rapide Est. Blood Loss (mL): 300  Mom to postpartum.  Baby to Couplet care / Skin to Skin. Pt plans to BF Routine PP orders   Mayumi Summerson M 11/03/2013, 6:26 AM

## 2013-11-03 ENCOUNTER — Encounter (HOSPITAL_COMMUNITY): Payer: Self-pay | Admitting: *Deleted

## 2013-11-03 ENCOUNTER — Inpatient Hospital Stay (HOSPITAL_COMMUNITY)
Admission: AD | Admit: 2013-11-03 | Discharge: 2013-11-05 | DRG: 775 | Disposition: A | Discharge status: Home / Self Care | Payer: 59 | Source: Ambulatory Visit | Attending: Obstetrics and Gynecology | Admitting: Obstetrics and Gynecology

## 2013-11-03 DIAGNOSIS — IMO0001 Reserved for inherently not codable concepts without codable children: Secondary | ICD-10-CM

## 2013-11-03 DIAGNOSIS — O99820 Streptococcus B carrier state complicating pregnancy: Secondary | ICD-10-CM

## 2013-11-03 DIAGNOSIS — Z22330 Carrier of Group B streptococcus: Secondary | ICD-10-CM

## 2013-11-03 DIAGNOSIS — O9989 Other specified diseases and conditions complicating pregnancy, childbirth and the puerperium: Secondary | ICD-10-CM

## 2013-11-03 DIAGNOSIS — O99892 Other specified diseases and conditions complicating childbirth: Secondary | ICD-10-CM | POA: Diagnosis present

## 2013-11-03 HISTORY — DX: Encounter for other specified aftercare: Z51.89

## 2013-11-03 HISTORY — DX: Anemia, unspecified: D64.9

## 2013-11-03 HISTORY — DX: Major depressive disorder, single episode, unspecified: F32.9

## 2013-11-03 HISTORY — DX: Depression, unspecified: F32.A

## 2013-11-03 HISTORY — DX: Anxiety disorder, unspecified: F41.9

## 2013-11-03 LAB — CBC
HCT: 35.8 % — ABNORMAL LOW (ref 36.0–46.0)
Hemoglobin: 12.2 g/dL (ref 12.0–15.0)
MCH: 30.2 pg (ref 26.0–34.0)
MCHC: 34.1 g/dL (ref 30.0–36.0)
MCV: 88.6 fL (ref 78.0–100.0)
PLATELETS: 102 10*3/uL — AB (ref 150–400)
RBC: 4.04 MIL/uL (ref 3.87–5.11)
RDW: 13.3 % (ref 11.5–15.5)
WBC: 11.5 10*3/uL — ABNORMAL HIGH (ref 4.0–10.5)

## 2013-11-03 LAB — TYPE AND SCREEN
ABO/RH(D): AB POS
Antibody Screen: NEGATIVE

## 2013-11-03 LAB — RPR: RPR Ser Ql: NONREACTIVE

## 2013-11-03 MED ORDER — MISOPROSTOL 200 MCG PO TABS
1000.0000 ug | ORAL_TABLET | Freq: Once | ORAL | Status: DC
  Filled 2013-11-03: qty 5

## 2013-11-03 MED ORDER — MEASLES, MUMPS & RUBELLA VAC ~~LOC~~ INJ
0.5000 mL | INJECTION | Freq: Once | SUBCUTANEOUS | Status: DC
  Filled 2013-11-03: qty 0.5

## 2013-11-03 MED ORDER — ZOLPIDEM TARTRATE 5 MG PO TABS: 5.0000 mg | ORAL_TABLET | Freq: Every evening | ORAL | Status: DC | PRN

## 2013-11-03 MED ORDER — BENZOCAINE-MENTHOL 20-0.5 % EX AERO
1.0000 "application " | INHALATION_SPRAY | CUTANEOUS | Status: DC | PRN
  Administered 2013-11-04: 1 via TOPICAL
  Filled 2013-11-03: qty 56

## 2013-11-03 MED ORDER — WITCH HAZEL-GLYCERIN EX PADS: 1.0000 "application " | MEDICATED_PAD | CUTANEOUS | Status: DC | PRN

## 2013-11-03 MED ORDER — ACETAMINOPHEN 325 MG PO TABS: 650.0000 mg | ORAL_TABLET | ORAL | Status: DC | PRN

## 2013-11-03 MED ORDER — LACTATED RINGERS IV SOLN: 500.0000 mL | INTRAVENOUS | Status: DC | PRN

## 2013-11-03 MED ORDER — SIMETHICONE 80 MG PO CHEW: 80.0000 mg | CHEWABLE_TABLET | ORAL | Status: DC | PRN

## 2013-11-03 MED ORDER — OXYTOCIN 40 UNITS IN LACTATED RINGERS INFUSION - SIMPLE MED
62.5000 mL/h | INTRAVENOUS | Status: DC
  Filled 2013-11-03: qty 1000

## 2013-11-03 MED ORDER — BISACODYL 10 MG RE SUPP: 10.0000 mg | Freq: Every day | RECTAL | Status: DC | PRN

## 2013-11-03 MED ORDER — DIPHENHYDRAMINE HCL 25 MG PO CAPS: 25.0000 mg | ORAL_CAPSULE | Freq: Four times a day (QID) | ORAL | Status: DC | PRN

## 2013-11-03 MED ORDER — TETANUS-DIPHTH-ACELL PERTUSSIS 5-2.5-18.5 LF-MCG/0.5 IM SUSP: 0.5000 mL | Freq: Once | INTRAMUSCULAR | Status: DC

## 2013-11-03 MED ORDER — PRENATAL MULTIVITAMIN CH
1.0000 | ORAL_TABLET | Freq: Every day | ORAL | Status: DC
  Administered 2013-11-03 – 2013-11-04 (×2): 1 via ORAL
  Filled 2013-11-03 (×2): qty 1

## 2013-11-03 MED ORDER — ONDANSETRON HCL 4 MG PO TABS: 4.0000 mg | ORAL_TABLET | ORAL | Status: DC | PRN

## 2013-11-03 MED ORDER — ONDANSETRON HCL 4 MG/2ML IJ SOLN
4.0000 mg | INTRAMUSCULAR | Status: DC | PRN
Start: 2013-11-03 — End: 2013-11-05

## 2013-11-03 MED ORDER — IBUPROFEN 600 MG PO TABS
600.0000 mg | ORAL_TABLET | Freq: Four times a day (QID) | ORAL | Status: DC | PRN
  Administered 2013-11-03: 600 mg via ORAL
  Filled 2013-11-03: qty 1

## 2013-11-03 MED ORDER — PENICILLIN G POTASSIUM 5000000 UNITS IJ SOLR
5.0000 10*6.[IU] | Freq: Once | INTRAVENOUS | Status: AC
  Administered 2013-11-03: 5 10*6.[IU] via INTRAVENOUS
  Filled 2013-11-03: qty 5

## 2013-11-03 MED ORDER — FLEET ENEMA 7-19 GM/118ML RE ENEM: 1.0000 | ENEMA | Freq: Every day | RECTAL | Status: DC | PRN

## 2013-11-03 MED ORDER — IBUPROFEN 600 MG PO TABS
600.0000 mg | ORAL_TABLET | Freq: Four times a day (QID) | ORAL | Status: DC
  Administered 2013-11-03 – 2013-11-04 (×5): 600 mg via ORAL
  Filled 2013-11-03 (×5): qty 1

## 2013-11-03 MED ORDER — OXYCODONE-ACETAMINOPHEN 5-325 MG PO TABS: 1.0000 | ORAL_TABLET | ORAL | Status: DC | PRN

## 2013-11-03 MED ORDER — OXYTOCIN BOLUS FROM INFUSION
500.0000 mL | INTRAVENOUS | Status: DC
  Administered 2013-11-03: 500 mL via INTRAVENOUS

## 2013-11-03 MED ORDER — CITRIC ACID-SODIUM CITRATE 334-500 MG/5ML PO SOLN: 30.0000 mL | ORAL | Status: DC | PRN

## 2013-11-03 MED ORDER — LANOLIN HYDROUS EX OINT: TOPICAL_OINTMENT | CUTANEOUS | Status: DC | PRN

## 2013-11-03 MED ORDER — LIDOCAINE HCL (PF) 1 % IJ SOLN
30.0000 mL | INTRAMUSCULAR | Status: AC | PRN
  Administered 2013-11-03: 30 mL via SUBCUTANEOUS
  Filled 2013-11-03 (×2): qty 30

## 2013-11-03 MED ORDER — PENICILLIN G POTASSIUM 5000000 UNITS IJ SOLR
2.5000 10*6.[IU] | INTRAVENOUS | Status: DC
  Filled 2013-11-03 (×3): qty 2.5

## 2013-11-03 MED ORDER — DIBUCAINE 1 % RE OINT: 1.0000 "application " | TOPICAL_OINTMENT | RECTAL | Status: DC | PRN

## 2013-11-03 MED ORDER — LACTATED RINGERS IV SOLN
INTRAVENOUS | Status: DC
  Administered 2013-11-03: 02:00:00 via INTRAVENOUS

## 2013-11-03 MED ORDER — ONDANSETRON HCL 4 MG/2ML IJ SOLN: 4.0000 mg | Freq: Four times a day (QID) | INTRAMUSCULAR | Status: DC | PRN

## 2013-11-03 MED ORDER — MEDROXYPROGESTERONE ACETATE 150 MG/ML IM SUSP: 150.0000 mg | INTRAMUSCULAR | Status: DC | PRN

## 2013-11-03 MED ORDER — SENNOSIDES-DOCUSATE SODIUM 8.6-50 MG PO TABS
2.0000 | ORAL_TABLET | ORAL | Status: DC
  Filled 2013-11-03: qty 2

## 2013-11-03 MED ORDER — OXYCODONE-ACETAMINOPHEN 5-325 MG PO TABS
1.0000 | ORAL_TABLET | ORAL | Status: DC | PRN
  Administered 2013-11-04: 1 via ORAL
  Filled 2013-11-03: qty 1

## 2013-11-03 NOTE — H&P (Signed)
Allison Garrett is a 33 y.o. female presenting for ctx, that have been stronger this evening, having bloody show, denies lg amt bleeding or LOF, +FM.   Pregnancy course:  [facility] Declined genetic screens Had normal anatomy scan with f/u anatomy for heart views Had 1hr glucola =135, and 3hr gtt normal  C/o fatigue and dizziness, TSH was check and was normal, vit D was found to low, supplements started Platelets were check frequently and most recently were 107 on 1/28 GBS pos   Maternal Medical History:  Reason for admission: Contractions.   Contractions: Onset was 6-12 hours ago.   Frequency: regular.   Duration is approximately 60 seconds.   Perceived severity is moderate.    Fetal activity: Perceived fetal activity is normal.   Last perceived fetal movement was within the past hour.    Prenatal complications: no prenatal complications Prenatal Complications - Diabetes: none.    OB History   Grav Para Term Preterm Abortions TAB SAB Ect Mult Living   3 1 1       1      Past Medical History  Diagnosis Date  . Anxiety   . Depression   . Anemia   . Blood transfusion without reported diagnosis     4 units after first delivery   Past Surgical History  Procedure Laterality Date  . No past surgeries     Family History: family history is not on file. Social History:  reports that she has never smoked. She has never used smokeless tobacco. She reports that she does not drink alcohol or use illicit drugs.   Prenatal Transfer Tool  Maternal Diabetes: No Genetic Screening: Declined Maternal Ultrasounds/Referrals: Normal Fetal Ultrasounds or other Referrals:  None Maternal Substance Abuse:  No Significant Maternal Medications:  None Significant Maternal Lab Results:  Lab values include: Group B Strep positive Other Comments:  None  Review of Systems  All other systems reviewed and are negative.    Dilation: 7 Effacement (%): 100 Station:  -1 Exam by:: S. Lilliard CNM Blood pressure 108/80, pulse 122, temperature 97.8 F (36.6 C), temperature source Oral, resp. rate 18, height 5\' 2"  (1.575 m), weight 169 lb (76.658 kg), last menstrual period 01/20/2013. Maternal Exam:  Uterine Assessment: Contraction strength is moderate.  Contraction frequency is regular.   Abdomen: Patient reports no abdominal tenderness. Fundal height is aga.   Estimated fetal weight is 7-8.   Fetal presentation: vertex  Introitus: Normal vulva. Normal vagina.  Pelvis: adequate for delivery.   Cervix: Cervix evaluated by digital exam.     Fetal Exam Fetal Monitor Review: Mode: ultrasound.    Fetal State Assessment: Category I - tracings are normal.     Physical Exam  Nursing note and vitals reviewed. Constitutional: She is oriented to person, place, and time. She appears well-developed and well-nourished.  HENT:  Head: Normocephalic.  Eyes: Pupils are equal, round, and reactive to light.  Neck: Normal range of motion.  Cardiovascular: Normal rate, regular rhythm and normal heart sounds.   Respiratory: Effort normal and breath sounds normal.  GI: Soft. Bowel sounds are normal.  Genitourinary: Vagina normal.  Musculoskeletal: Normal range of motion.  Neurological: She is alert and oriented to person, place, and time. She has normal reflexes.  Skin: Skin is warm and dry.  Psychiatric: She has a normal mood and affect. Her behavior is normal.    Prenatal labs: ABO, Rh: AB/Positive/-- (06/24 0000) Antibody: Negative (06/24 0000)  Rubella: Immune (06/24 0000) RPR: Nonreactive (06/24 0000)  HBsAg: Negative (06/24 0000)  HIV: Non-reactive (06/24 0000)  GBS: Positive (12/29 0000)   Assessment/Plan: IUP at 40w GBS pos Active labor FHR cat 1 Desires WB  Admit to b.s per c/w Dr Dion Body Routine L&D orders PCN per protocol for +GBS Ok for [facility]  May have intermittent EFM    Norval Slaven M 11/03/2013, 1:57 AM

## 2013-11-03 NOTE — Lactation Note (Signed)
This note was copied from the chart of Allison Susa Lofflerrystal Gaumer. Lactation Consultation Note Initial Consult: Baby Allison 2511 hours old sleeping skin to skin on mother's chest. P2, breastfed first baby Allison for 3 years despite many challenges.  Reviewed hand expression and hand pump use. Baby recently breastfed for 40 min, mother denies soreness. Reviewed basics, cluster feeding, Baby & Me booklet pp 20-24, STS, channel 11, lactation support and brochure. Encouraged mother to come to support group to encourage other mothers and call for further assistance and questions  Patient Name: Allison Garrett Today's Date: 11/03/2013     Maternal Data    Feeding Feeding Type: Breast Milk Length of feed: 40 min  LATCH Score/Interventions                      Lactation Tools Discussed/Used     Consult Status Consult Status: Follow-up Date: 11/04/13 Follow-up type: In-patient    Dahlia ByesBerkelhammer, Arvilla Salada [facility] 11/03/2013, 3:49 PM

## 2013-11-03 NOTE — Progress Notes (Signed)
UR chart review completed.  

## 2013-11-03 NOTE — Progress Notes (Signed)
Patient was referred for history of depression/anxiety.  * Referral screened out by Clinical Social Worker because none of the following criteria appear to apply:  ~ History of anxiety/depression during this pregnancy, or of postpartum depression.  ~ Diagnosis of anxiety and/or depression within last 8 years, per chart review.  ~ History of depression due to pregnancy loss/loss of child  OR  * Patient's symptoms currently being treated with medication and/or therapy.  Please contact the Clinical Social Worker if needs arise, or by the patient's request.       

## 2013-11-04 LAB — CBC
HCT: 26.4 % — ABNORMAL LOW (ref 36.0–46.0)
Hemoglobin: 9.1 g/dL — ABNORMAL LOW (ref 12.0–15.0)
MCH: 31.5 pg (ref 26.0–34.0)
MCHC: 34.5 g/dL (ref 30.0–36.0)
MCV: 91.3 fL (ref 78.0–100.0)
Platelets: 81 10*3/uL — ABNORMAL LOW (ref 150–400)
RBC: 2.89 MIL/uL — AB (ref 3.87–5.11)
RDW: 13.8 % (ref 11.5–15.5)
WBC: 7.9 10*3/uL (ref 4.0–10.5)

## 2013-11-04 MED ORDER — ACETAMINOPHEN 325 MG PO TABS
650.0000 mg | ORAL_TABLET | Freq: Four times a day (QID) | ORAL | Status: DC | PRN
  Administered 2013-11-05: 650 mg via ORAL
  Filled 2013-11-04: qty 2

## 2013-11-04 NOTE — Lactation Note (Addendum)
This note was copied from the chart of [name]. Lactation Consultation Note  Patient Name: [name] Today's Date: 11/04/2013 Reason for consult: Follow-up assessment -this is an experienced breastfeeding mother of 3 years .  Per mom with her 1st baby had some latching challenges and then it worked out and the baby fed for 3 years. Milk supply wasn't an issue. Per mom this baby is latching well . Baby woke up while LC with mom , showed  mom the cross cradle. Baby able to latch with depth , multiply swallows noted, increased with breast compressions. Also reviewed the football position, baby latched with depth, multiply swallows noted. Wet and transitional stool  changed at consult. Mom denies sore ness and nipple on left breast normal shape when baby released.  Reviewed engorgement prevention and tx , referring to baby and me booklet pg 24 if needed. Mom aware [facility].   Maternal Data Has patient been taught Hand Expression?: Yes (steady flow ) Does the patient have breastfeeding experience prior to this delivery?: Yes  Feeding Feeding Type: Breast Fed Length of feed:  (still feeding multiply swallowing pattern )  LATCH Score/Interventions Latch: Grasps breast easily, tongue down, lips flanged, rhythmical sucking. (right breast football )  Audible Swallowing: Spontaneous and intermittent Intervention(s): Skin to skin  Type of Nipple: Everted at rest and after stimulation  Comfort (Breast/Nipple): Soft / non-tender     Hold (Positioning): Assistance needed to correctly position infant at breast and maintain latch. (LC eased down chin for depth , assisted with postioning ) Intervention(s): Breastfeeding basics reviewed;Support Pillows;Position options;Skin to skin  LATCH Score: 9  Lactation Tools Discussed/Used     Consult Status Consult Status: Follow-up Date: 11/05/13 Follow-up type: In-patient    Allison Greathouseorio, Kaidan Spengler  Ann 11/04/2013, 4:02 PM

## 2013-11-04 NOTE — Progress Notes (Signed)
Post Partum Day 1:S/P SVB, waterbirth, 1st degree laceration Subjective: Patient up ad lib, denies syncope or dizziness. Feeding:  Breast Contraceptive plan:   Undecided at present Unable to be d/c'd today due to GBS status, incomplete treatment.  Objective: Blood pressure 102/64, pulse 100, temperature 97.8 F (36.6 C), temperature source Oral, resp. rate 20, height 5\' 2"  (1.575 m), weight 169 lb (76.658 kg), last menstrual period 01/20/2013, SpO2 97.00%, unknown if currently breastfeeding.  Physical Exam:  General: alert Lochia: appropriate Uterine Fundus: firm Incision: healing well DVT Evaluation: No evidence of DVT seen on physical exam. Negative Homan's sign.   Recent Labs  11/03/13 0150  HGB 12.2  HCT 35.8*    Assessment/Plan: S/P Vaginal delivery day 1 Continue current care Plan for discharge tomorrow   LOS: 1 day   Allison Garrett 11/04/2013, 7:03 AM

## 2013-11-05 MED ORDER — FERROUS SULFATE 325 (65 FE) MG PO TABS: 325.0000 mg | ORAL_TABLET | Freq: Two times a day (BID) | ORAL | Status: DC

## 2013-11-05 MED ORDER — DOCUSATE SODIUM 100 MG PO CAPS: 100.0000 mg | ORAL_CAPSULE | Freq: Two times a day (BID) | ORAL | Status: DC

## 2013-11-05 MED ORDER — OXYCODONE-ACETAMINOPHEN 5-325 MG PO TABS: 1.0000 | ORAL_TABLET | ORAL | Status: DC | PRN

## 2013-11-05 NOTE — Discharge Instructions (Signed)
Postpartum Depression and Baby Blues °The postpartum period begins right after the birth of a baby. During this time, there is often a great amount of joy and excitement. It is also a time of considerable changes in the life of the parent(s). Regardless of how many times a mother gives birth, each child brings new challenges and dynamics to the family. It is not unusual to have feelings of excitement accompanied by confusing shifts in moods, emotions, and thoughts. All mothers are at risk of developing postpartum depression or the "baby blues." These mood changes can occur right after giving birth, or they may occur many months after giving birth. The baby blues or postpartum depression can be mild or severe. Additionally, postpartum depression can resolve rather quickly, or it can be a long-term condition. °CAUSES °Elevated hormones and their rapid decline are thought to be a main cause of postpartum depression and the baby blues. There are a number of hormones that radically change during and after pregnancy. Estrogen and progesterone usually decrease immediately after delivering your baby. The level of thyroid hormone and various cortisol steroids also rapidly drop. Other factors that play a major role in these changes include major life events and genetics.  °RISK FACTORS °If you have any of the following risks for the baby blues or postpartum depression, know what symptoms to watch out for during the postpartum period. Risk factors that may increase the likelihood of getting the baby blues or postpartum depression include: °· Having a personal or family history of depression. °· Having depression while being pregnant. °· Having premenstrual or oral contraceptive-associated mood issues. °· Having exceptional life stress. °· Having marital conflict. °· Lacking a social support network. °· Having a baby with special needs. °· Having health problems such as diabetes. °SYMPTOMS °Baby blues symptoms include: °· Brief  fluctuations in mood, such as going from extreme happiness to sadness. °· Decreased concentration. °· Difficulty sleeping. °· Crying spells, tearfulness. °· Irritability. °· Anxiety. °Postpartum depression symptoms typically begin within the first month after giving birth. These symptoms include: °· Difficulty sleeping or excessive sleepiness. °· Marked weight loss. °· Agitation. °· Feelings of worthlessness. °· Lack of interest in activity or food. °Postpartum psychosis is a very concerning condition and can be dangerous. Fortunately, it is rare. Displaying any of the following symptoms is cause for immediate medical attention. Postpartum psychosis symptoms include: °· Hallucinations and delusions. °· Bizarre or disorganized behavior. °· Confusion or disorientation. °DIAGNOSIS  °A diagnosis is made by an evaluation of your symptoms. There are no medical or lab tests that lead to a diagnosis, but there are various questionnaires that a caregiver may use to identify those with the baby blues, postpartum depression, or psychosis. Often times, a screening tool called the [place] Postnatal Depression Scale is used to diagnose depression in the postpartum period.  °TREATMENT °The baby blues usually goes away on its own in 1 to 2 weeks. Social support is often all that is needed. You should be encouraged to get adequate sleep and rest. Occasionally, you may be given medicines to help you sleep.  °Postpartum depression requires treatment as it can last several months or longer if it is not treated. Treatment may include individual or group therapy, medicine, or both to address any social, physiological, and psychological factors that may play a role in the depression. Regular exercise, a healthy diet, rest, and social support may also be strongly recommended.  °Postpartum psychosis is more serious and needs treatment right away. Hospitalization is   often needed. °HOME CARE INSTRUCTIONS °· Get as much rest as you can. Nap  when the baby sleeps. °· Exercise regularly. Some women find yoga and walking to be beneficial. °· Eat a balanced and nourishing diet. °· Do little things that you enjoy. Have a cup of tea, take a bubble bath, read your favorite magazine, or listen to your favorite music. °· Avoid alcohol. °· Ask for help with household chores, cooking, grocery shopping, or running errands as needed. Do not try to do everything. °· Talk to people close to you about how you are feeling. Get support from your partner, family members, friends, or other new moms. °· Try to stay positive in how you think. Think about the things you are grateful for. °· Do not spend a lot of time alone. °· Only take medicine as directed by your caregiver. °· Keep all your postpartum appointments. °· Let your caregiver know if you have any concerns. °SEEK MEDICAL CARE IF: °You are having a reaction or problems with your medicine. °SEEK IMMEDIATE MEDICAL CARE IF: °· You have suicidal feelings. °· You feel you may harm the baby or someone else. °Document Released: 06/21/2004 Document Revised: 12/10/2011 Document Reviewed: 06/29/2013 °ExitCare® Patient Information ©2014 [employer]. °Postpartum Care After Vaginal Delivery °After you deliver your newborn (postpartum period), the usual stay in the hospital is 24 72 hours. If there were problems with your labor or delivery, or if you have other medical problems, you might be in the hospital longer.  °While you are in the hospital, you will receive help and instructions on how to care for yourself and your newborn during the postpartum period.  °While you are in the hospital: °· Be sure to tell your nurses if you have pain or discomfort, as well as where you feel the pain and what makes the pain worse. °· If you had an incision made near your vagina (episiotomy) or if you had some tearing during delivery, the nurses may put ice packs on your episiotomy or tear. The ice packs may help to reduce the pain and  swelling. °· If you are breastfeeding, you may feel uncomfortable contractions of your uterus for a couple of weeks. This is normal. The contractions help your uterus get back to normal size. °· It is normal to have some bleeding after delivery. °· For the first 1 3 days after delivery, the flow is red and the amount may be similar to a period. °· It is common for the flow to start and stop. °· In the first few days, you may pass some small clots. Let your nurses know if you begin to pass large clots or your flow increases. °· Do not  flush blood clots down the toilet before having the nurse look at them. °· During the next 3 10 days after delivery, your flow should become more watery and pink or brown-tinged in color. °· Ten to fourteen days after delivery, your flow should be a small amount of yellowish-white discharge. °· The amount of your flow will decrease over the first few weeks after delivery. Your flow may stop in 6 8 weeks. Most women have had their flow stop by 12 weeks after delivery. °· You should change your sanitary pads frequently. °· Wash your hands thoroughly with soap and water for at least 20 seconds after changing pads, using the toilet, or before holding or feeding your newborn. °· You should feel like you need to empty your bladder within the first   6 8 hours after delivery. °· In case you become weak, lightheaded, or faint, call your nurse before you get out of bed for the first time and before you take a shower for the first time. °· Within the first few days after delivery, your breasts may begin to feel tender and full. This is called engorgement. Breast tenderness usually goes away within 48 72 hours after engorgement occurs. You may also notice milk leaking from your breasts. If you are not breastfeeding, do not stimulate your breasts. Breast stimulation can make your breasts produce more milk. °· Spending as much time as possible with your newborn is very important. During this time,  you and your newborn can feel close and get to know each other. Having your newborn stay in your room (rooming in) will help to strengthen the bond with your newborn.  It will give you time to get to know your newborn and become comfortable caring for your newborn. °· Your hormones change after delivery. Sometimes the hormone changes can temporarily cause you to feel sad or tearful. These feelings should not last more than a few days. If these feelings last longer than that, you should talk to your caregiver. °· If desired, talk to your caregiver about methods of family planning or contraception. °· Talk to your caregiver about immunizations. Your caregiver may want you to have the following immunizations before leaving the hospital: °· Tetanus, diphtheria, and pertussis (Tdap) or tetanus and diphtheria (Td) immunization. It is very important that you and your family (including grandparents) or others caring for your newborn are up-to-date with the Tdap or Td immunizations. The Tdap or Td immunization can help protect your newborn from getting ill. °· Rubella immunization. °· Varicella (chickenpox) immunization. °· Influenza immunization. You should receive this annual immunization if you did not receive the immunization during your pregnancy. °Document Released: 07/15/2007 Document Revised: 06/11/2012 Document Reviewed: 05/14/2012 °ExitCare® Patient Information ©2014 [employer]. ° °

## 2013-11-12 ENCOUNTER — Ambulatory Visit (HOSPITAL_COMMUNITY): Admit: 2013-11-12 | Payer: 59

## 2013-11-23 NOTE — Discharge Summary (Signed)
Vaginal Delivery Discharge Summary  Allison Garrett  DOB:    03-Dec-1980 MRN:    [id number] CSN:    [id number]  Date of admission:                  11/03/13  Date of discharge:                   11/05/13   Procedures this admission: SVD - waterbirth by S.Azusena Erlandson  Date of Delivery: 11/03/13   Newborn Data:  Live born female  Birth Weight: 8 lb 7.6 oz (3844 g) APGAR: 7, 9  Home with mother.    History of Present Illness:  Allison Garrett is a 33 y.o. female, G2P2002, who presents at [redacted]w[redacted]d weeks gestation. The patient has been followed at the [facility] of [facility]. She was admitted onset of labor. Her pregnancy has been complicated by: none.  Hospital course:  The patient was admitted for active labor.   Her labor was not complicated. She proceeded to have a vaginal delivery of a healthy infant. Her delivery was not complicated. Her postpartum course was not complicated.  She was discharged to home on postpartum day 2 doing well.  Feeding:  breast  Contraception:  condoms  Discharge hemoglobin:  Hemoglobin  Date Value Ref Range Status  11/04/2013 9.1* 12.0 - 15.0 g/dL Final     DELTA CHECK NOTED     REPEATED TO VERIFY  03/24/2013 11.8   Final     HCT  Date Value Ref Range Status  11/04/2013 26.4* 36.0 - 46.0 % Final    Discharge Physical Exam:   General: alert and no distress Lochia: appropriate Uterine Fundus: firm Incision: healing well DVT Evaluation: No evidence of DVT seen on physical exam. Negative Homan's sign. No significant calf/ankle edema.  Intrapartum Procedures: spontaneous vaginal delivery and GBS prophylaxis Postpartum Procedures: none Complications-Operative and Postpartum: none  Discharge Diagnoses: Term Pregnancy-delivered  Discharge Information:  Activity:           pelvic rest Diet:                routine Medications: PNV Condition:      stable Instructions:    Postpartum Care After Vaginal Delivery  After you deliver your newborn (postpartum period), the usual stay in the hospital is 24 72 hours. If there were problems with your labor or delivery, or if you have other medical problems, you might be in the hospital longer.  While you are in the hospital, you will receive help and instructions on how to care for yourself and your newborn during the postpartum period.  While you are in the hospital:  Be sure to tell your nurses if you have pain or discomfort, as well as where you feel the pain and what makes the pain worse.  If you had an incision made near your vagina (episiotomy) or if you had some tearing during delivery, the nurses may put ice packs on your episiotomy or tear. The ice packs may help to reduce the pain and swelling.  If you are breastfeeding, you may feel uncomfortable contractions of your uterus for a couple of weeks. This is normal. The contractions help your uterus get back to normal size.  It is normal to have some bleeding after delivery.  For the first 1 3 days after delivery, the flow is red and the amount may be similar to a period.  It is  common for the flow to start and stop.  In the first few days, you may pass some small clots. Let your nurses know if you begin to pass large clots or your flow increases.  Do not  flush blood clots down the toilet before having the nurse look at them.  During the next 3 10 days after delivery, your flow should become more watery and pink or brown-tinged in color.  Ten to fourteen days after delivery, your flow should be a small amount of yellowish-white discharge.  The amount of your flow will decrease over the first few weeks after delivery. Your flow may stop in 6 8 weeks. Most women have had their flow stop by 12 weeks after delivery.  You should change your sanitary pads frequently.  Wash your hands thoroughly with soap and water for at least 20 seconds after changing pads,  using the toilet, or before holding or feeding your newborn.  You should feel like you need to empty your bladder within the first 6 8 hours after delivery.  In case you become weak, lightheaded, or faint, call your nurse before you get out of bed for the first time and before you take a shower for the first time.  Within the first few days after delivery, your breasts may begin to feel tender and full. This is called engorgement. Breast tenderness usually goes away within 48 72 hours after engorgement occurs. You may also notice milk leaking from your breasts. If you are not breastfeeding, do not stimulate your breasts. Breast stimulation can make your breasts produce more milk.  Spending as much time as possible with your newborn is very important. During this time, you and your newborn can feel close and get to know each other. Having your newborn stay in your room (rooming in) will help to strengthen the bond with your newborn. It will give you time to get to know your newborn and become comfortable caring for your newborn.  Your hormones change after delivery. Sometimes the hormone changes can temporarily cause you to feel sad or tearful. These feelings should not last more than a few days. If these feelings last longer than that, you should talk to your caregiver.  If desired, talk to your caregiver about methods of family planning or contraception.  Talk to your caregiver about immunizations. Your caregiver may want you to have the following immunizations before leaving the hospital:  Tetanus, diphtheria, and pertussis (Tdap) or tetanus and diphtheria (Td) immunization. It is very important that you and your family (including grandparents) or others caring for your newborn are up-to-date with the Tdap or Td immunizations. The Tdap or Td immunization can help protect your newborn from getting ill.  Rubella immunization.  Varicella (chickenpox) immunization.  Influenza immunization. You  should receive this annual immunization if you did not receive the immunization during your pregnancy. Document Released: 07/15/2007 Document Revised: 06/11/2012 Document Reviewed: 05/14/2012 [facility] Patient Information 2014 [place], [place].   Postpartum Depression and Baby Blues  The postpartum period begins right after the birth of a baby. During this time, there is often a great amount of joy and excitement. It is also a time of considerable changes in the life of the parent(s). Regardless of how many times a mother gives birth, each child brings new challenges and dynamics to the family. It is not unusual to have feelings of excitement accompanied by confusing shifts in moods, emotions, and thoughts. All mothers are at risk of developing postpartum depression or  the "baby blues." These mood changes can occur right after giving birth, or they may occur many months after giving birth. The baby blues or postpartum depression can be mild or severe. Additionally, postpartum depression can resolve rather quickly, or it can be a long-term condition. CAUSES Elevated hormones and their rapid decline are thought to be a main cause of postpartum depression and the baby blues. There are a number of hormones that radically change during and after pregnancy. Estrogen and progesterone usually decrease immediately after delivering your baby. The level of thyroid hormone and various cortisol steroids also rapidly drop. Other factors that play a major role in these changes include major life events and genetics.  RISK FACTORS If you have any of the following risks for the baby blues or postpartum depression, know what symptoms to watch out for during the postpartum period. Risk factors that may increase the likelihood of getting the baby blues or postpartum depression include:  Havinga personal or family history of depression.  Having depression while being pregnant.  Having premenstrual or oral  contraceptive-associated mood issues.  Having exceptional life stress.  Having marital conflict.  Lacking a social support network.  Having a baby with special needs.  Having health problems such as diabetes. SYMPTOMS Baby blues symptoms include:  Brief fluctuations in mood, such as going from extreme happiness to sadness.  Decreased concentration.  Difficulty sleeping.  Crying spells, tearfulness.  Irritability.  Anxiety. Postpartum depression symptoms typically begin within the first month after giving birth. These symptoms include:  Difficulty sleeping or excessive sleepiness.  Marked weight loss.  Agitation.  Feelings of worthlessness.  Lack of interest in activity or food. Postpartum psychosis is a very concerning condition and can be dangerous. Fortunately, it is rare. Displaying any of the following symptoms is cause for immediate medical attention. Postpartum psychosis symptoms include:  Hallucinations and delusions.  Bizarre or disorganized behavior.  Confusion or disorientation. DIAGNOSIS  A diagnosis is made by an evaluation of your symptoms. There are no medical or lab tests that lead to a diagnosis, but there are various questionnaires that a caregiver may use to identify those with the baby blues, postpartum depression, or psychosis. Often times, a screening tool called the [place] Postnatal Depression Scale is used to diagnose depression in the postpartum period.  TREATMENT The baby blues usually goes away on its own in 1 to 2 weeks. Social support is often all that is needed. You should be encouraged to get adequate sleep and rest. Occasionally, you may be given medicines to help you sleep.  Postpartum depression requires treatment as it can last several months or longer if it is not treated. Treatment may include individual or group therapy, medicine, or both to address any social, physiological, and psychological factors that may play a role in the  depression. Regular exercise, a healthy diet, rest, and social support may also be strongly recommended.  Postpartum psychosis is more serious and needs treatment right away. Hospitalization is often needed. HOME CARE INSTRUCTIONS  Get as much rest as you can. Nap when the baby sleeps.  Exercise regularly. Some women find yoga and walking to be beneficial.  Eat a balanced and nourishing diet.  Do little things that you enjoy. Have a cup of tea, take a bubble bath, read your favorite magazine, or listen to your favorite music.  Avoid alcohol.  Ask for help with household chores, cooking, grocery shopping, or running errands as needed. Do not try to do everything.  Talk  to people close to you about how you are feeling. Get support from your partner, family members, friends, or other new moms.  Try to stay positive in how you think. Think about the things you are grateful for.  Do not spend a lot of time alone.  Only take medicine as directed by your caregiver.  Keep all your postpartum appointments.  Let your caregiver know if you have any concerns. SEEK MEDICAL CARE IF: You are having a reaction or problems with your medicine. SEEK IMMEDIATE MEDICAL CARE IF:  You have suicidal feelings.  You feel you may harm the baby or someone else. Document Released: 06/21/2004 Document Revised: 12/10/2011 Document Reviewed: 07/24/2011 [facility] Patient Information 2014 [place], [place].   Discharge to: home  Follow-up Information   Follow up with [facility] In 6 weeks.   Contact information:   [address], [address] [place] [address]        Malissa Hippo 11/23/2013

## 2014-08-02 ENCOUNTER — Encounter (HOSPITAL_COMMUNITY): Payer: Self-pay | Admitting: *Deleted

## 2015-07-01 ENCOUNTER — Emergency Department (INDEPENDENT_AMBULATORY_CARE_PROVIDER_SITE_OTHER)
Admission: EM | Admit: 2015-07-01 | Discharge: 2015-07-01 | Payer: 59 | Source: Home / Self Care | Attending: Family Medicine | Admitting: Family Medicine

## 2015-07-01 DIAGNOSIS — R0602 Shortness of breath: Secondary | ICD-10-CM

## 2015-07-01 DIAGNOSIS — R079 Chest pain, unspecified: Secondary | ICD-10-CM

## 2015-07-01 DIAGNOSIS — R000 Tachycardia, unspecified: Secondary | ICD-10-CM

## 2015-07-01 NOTE — ED Provider Notes (Signed)
CSN: [id number]     Arrival date & time 07/01/15  1611 History   First MD Initiated Contact with Patient 07/01/15 1614     Chief Complaint  Patient presents with  . Shortness of Breath   (Consider location/radiation/quality/duration/timing/severity/associated sxs/prior Treatment) HPI Pt is a 34yo female with hx of anxiety and depression brought to [facility] by her husband for possible asthma attack. Pt state she was sitting in her car at school pick-up line to pick up her daughter when she became short of breath and started to feel chest tightness. Pt used her daughter's albuterol inhaler but states she did not feel much relief. Pt c/o palpitations and sensation of her heart racing.  Pt states she has felt briefer episodes like this with her heart "skipping beats" over the last few weeks but states those only last a few seconds.  Symptoms started about 30 minutes ago.  Pt denies hx of asthma but states she used to have it when she was a kid. Denies being bit or stung by any insects. Denies rashes. Denies being around fragrances or fumes. No known allergies. No known personal or family cardiac history.  Denies diaphoresis, nausea or vomiting.  Pt does note she was on Lexapro then stopped it but restarted taking it about 2 weeks ago.  She stopped 2 days ago due to palpitations but states symptoms had not improved.  Denies leg pain or swelling. Denies hx of DVT or PE. Denies recent surgery. Denies being on birth control or smoking.  Past Medical History  Diagnosis Date  . Anxiety   . Depression   . Anemia   . Blood transfusion without reported diagnosis     4 units after first delivery   Past Surgical History  Procedure Laterality Date  . No past surgeries     No family history on file. Social History  Substance Use Topics  . Smoking status: Never Smoker   . Smokeless tobacco: Never Used  . Alcohol Use: No   OB History    Gravida Para Term Preterm AB TAB SAB Ectopic Multiple Living   Review of Systems  Constitutional: Negative for fever and chills.  HENT: Negative for congestion, ear pain, sore throat, trouble swallowing and voice change.   Respiratory: Positive for chest tightness and shortness of breath. Negative for cough.   Cardiovascular: Positive for palpitations. Negative for chest pain.  Gastrointestinal: Negative for nausea, vomiting, abdominal pain and diarrhea.  Musculoskeletal: Negative for myalgias, back pain and arthralgias.  Skin: Negative for rash.  Neurological: Positive for dizziness. Negative for light-headedness and headaches.  Psychiatric/Behavioral: The patient is nervous/anxious.   All other systems reviewed and are negative.   Allergies  Review of patient's allergies indicates no known allergies.  Home Medications   Prior to Admission medications   Medication Sig Start Date End Date Taking? Authorizing Vernell Townley  docusate sodium (COLACE) 100 MG capsule Take 1 capsule (100 mg total) by mouth 2 (two) times daily. 11/05/13   Kirkland Hun, MD  ferrous sulfate (FERROUSUL) 325 (65 FE) MG tablet Take 1 tablet (325 mg total) by mouth 2 (two) times daily with a meal. 11/05/13   Kirkland Hun, MD  oxyCODONE-acetaminophen (ROXICET) 5-325 MG per tablet Take 1 tablet by mouth every 4 (four) hours as needed for severe pain. 11/05/13   Kirkland Hun, MD  Prenatal Vit-Fe Fumarate-FA (PRENATAL MULTIVITAMIN) TABS tablet Take 1 tablet by mouth  daily at 12 noon.    Historical Perline Awe, MD   Meds Ordered and Administered this Visit  Medications - No data to display  BP 155/95 mmHg  Pulse 147  Temp(Src) 98.4 F (36.9 C) (Oral)  SpO2 100% No data found.   Physical Exam  Constitutional: She appears well-developed and well-nourished. No distress.  HENT:  Head: Normocephalic and atraumatic.  Eyes: Conjunctivae are normal. No scleral icterus.  Neck: Normal range of motion.  Cardiovascular: Regular rhythm and normal heart sounds.  Tachycardia  present.   Pulmonary/Chest: Effort normal and breath sounds normal. No respiratory distress. She has no wheezes. She has no rales. She exhibits no tenderness.  Abdominal: Soft. She exhibits no distension. There is no tenderness.  Musculoskeletal: Normal range of motion.  Neurological: She is alert.  Skin: Skin is warm and dry. She is not diaphoretic.  Psychiatric: Her mood appears anxious.  Nursing note and vitals reviewed.   ED Course  Procedures (including critical care time)  Labs Review Labs Reviewed - No data to display  Imaging Review No results found.   EKG: Sinus tachycardia, ST depression, non-diagnostic -possible digitalis effect, consider subendocardial injury/ischemia.   -No old EKG to compare. Ventricular HR- 135 bpm   MDM   1. Chest pain, unspecified chest pain type     Pt c/o SOB, chest tightness and palpitations. She does have childhood hx of asthma but not as an adult. Used daughter's albuterol inhaler w/o relief. No known allergies. No urticaria. No personal or family hx of CAD. O2 Sat 100% on RA HR- between 130s-140s but low risk for PE. EKG abnormalities noted above Discussed calling EMS to transport pt to nearest emergency department for further evaluation of pt's tachycardia, husband declined. Agreed to transport pt via POV to [facility] ER.  Pt is stable, this Jaiel Saraceno is agreeable with plan for pt to go POV with husband.  EKG copy provided to pt to give to ER staff.     Junius Finner, PA-C 07/01/15 1626

## 2015-07-01 NOTE — ED Notes (Signed)
Came in complaining of Shortness of breath and fast heart rate.  Heart rate 147, BP 155/95 100%SAO2.  EKG completed and husband taking to [facility] ED.

## 2017-04-05 ENCOUNTER — Ambulatory Visit (HOSPITAL_COMMUNITY): Payer: 59 | Admitting: Psychiatry

## 2018-08-31 ENCOUNTER — Encounter: Payer: Self-pay | Admitting: Emergency Medicine

## 2018-08-31 ENCOUNTER — Other Ambulatory Visit: Payer: Self-pay

## 2018-08-31 ENCOUNTER — Emergency Department
Admission: EM | Admit: 2018-08-31 | Discharge: 2018-08-31 | Disposition: A | Discharge status: Home / Self Care | Payer: 59 | Source: Home / Self Care | Attending: Family Medicine | Admitting: Family Medicine

## 2018-08-31 DIAGNOSIS — R000 Tachycardia, unspecified: Secondary | ICD-10-CM

## 2018-08-31 DIAGNOSIS — J0391 Acute recurrent tonsillitis, unspecified: Secondary | ICD-10-CM

## 2018-08-31 LAB — POCT CBC W AUTO DIFF (K'VILLE URGENT CARE)

## 2018-08-31 LAB — POCT MONO SCREEN (KUC): Mono, POC: NEGATIVE

## 2018-08-31 LAB — POCT RAPID STREP A (OFFICE): Rapid Strep A Screen: NEGATIVE

## 2018-08-31 NOTE — ED Triage Notes (Signed)
Here with sore throat, tonsillitis x 1 week. Denies fever, chills,n,v. Hx white coat syndrome BP/HR elevated.

## 2018-08-31 NOTE — Discharge Instructions (Signed)
°  You will be notified of the rest of today's lab results when they come within the next 1 week.   In the meantime, you may take Tylenol and Motrin for any pain.  Please follow up with family medicine and with an ear nose throat (ENT) specialist for further evaluation of recurrent tonsillitis.

## 2018-08-31 NOTE — ED Provider Notes (Signed)
[name]    CSN: [id number] Arrival date & time: 08/31/18  1601     History   Chief Complaint Chief Complaint  Patient presents with  . Sore Throat    HPI Allison Garrett is a 37 y.o. female.   HPI Allison Garrett is a 37 y.o. female presenting to UC with c/o recurrent tonsillitis over the last 2 months with associated fatigue. Pt reports minimal to no pain, slight discomfort because she can feel her tonsils swelling at times.  Denies difficulty breathing or swallowing. She tested negative for strep a few weeks ago when she noticed pus coming out of her tonsils but is concerned her symptoms keep returning.  No hx of mono but her mother does have epstein bar virus when she was tested for fatigue.   HR elevated in triage. Hx of same due to white coat syndrome.   Past Medical History:  Diagnosis Date  . Anemia   . Anxiety   . Blood transfusion without reported diagnosis    4 units after first delivery  . Depression     Patient Active Problem List   Diagnosis Date Noted  . Vaginal delivery - waterbirth  11/03/2013  . GBS (group B Streptococcus carrier), +RV culture, currently pregnant 11/03/2013    Past Surgical History:  Procedure Laterality Date  . NO PAST SURGERIES      OB History    Gravida  2   Para  2   Term  2   Preterm      AB      Living  2     SAB      TAB      Ectopic      Multiple      Live Births  1            Home Medications    Prior to Admission medications   Medication Sig Start Date End Date Taking? Authorizing Provider  escitalopram (LEXAPRO) 5 MG tablet Take 5 mg by mouth daily.   Yes [provider]  Prenatal Vit-Fe Fumarate-FA (PRENATAL MULTIVITAMIN) TABS tablet Take 1 tablet by mouth daily at 12 noon.   Yes [provider]    Family History History reviewed. No pertinent family history.  Social History Social History   Tobacco Use  . Smoking status: Never Smoker  . Smokeless  tobacco: Never Used  Substance Use Topics  . Alcohol use: No  . Drug use: No     Allergies   Patient has no known allergies.   Review of Systems Review of Systems  Constitutional: Positive for fatigue. Negative for chills and fever.  HENT: Positive for sore throat. Negative for congestion, ear pain, trouble swallowing and voice change.   Respiratory: Negative for cough and shortness of breath.   Cardiovascular: Negative for chest pain and palpitations.  Gastrointestinal: Negative for abdominal pain, diarrhea, nausea and vomiting.  Musculoskeletal: Negative for arthralgias, back pain and myalgias.  Skin: Negative for rash.  Neurological: Positive for headaches (mild intermittent). Negative for dizziness and light-headedness.     Physical Exam Triage Vital Signs ED Triage Vitals [08/31/18 1646]  Enc Vitals Group     BP (!) 141/91     Pulse Rate (!) 147     Resp      Temp 98 F (36.7 C)     Temp Source Oral     SpO2 100 %     Weight 145 lb 12.8 oz (66.1 kg)  Height 5\' 2"  (1.575 m)     Head Circumference      Peak Flow      Pain Score 0     Pain Loc      Pain Edu?      Excl. in GC?    No data found.  Updated Vital Signs BP (!) 141/91 (BP Location: Left Arm)   Pulse (!) 121   Temp 98 F (36.7 C) (Oral)   Ht 5\' 2"  (1.575 m)   Wt 145 lb 12.8 oz (66.1 kg)   SpO2 100%   BMI 26.67 kg/m   Visual Acuity Right Eye Distance:   Left Eye Distance:   Bilateral Distance:    Right Eye Near:   Left Eye Near:    Bilateral Near:     Physical Exam  Constitutional: She is oriented to person, place, and time. She appears well-developed and well-nourished.  HENT:  Head: Normocephalic and atraumatic.  Right Ear: Tympanic membrane normal.  Left Ear: Tympanic membrane normal.  Nose: Nose normal. Right sinus exhibits no maxillary sinus tenderness and no frontal sinus tenderness. Left sinus exhibits no maxillary sinus tenderness and no frontal sinus tenderness.    Mouth/Throat: Uvula is midline and mucous membranes are normal. Oropharyngeal exudate, posterior oropharyngeal edema and posterior oropharyngeal erythema present. Tonsils are 2+ on the right. Tonsils are 2+ on the left.  Eyes: EOM are normal.  Neck: Normal range of motion. Neck supple.  Cardiovascular: Regular rhythm. Tachycardia present.  Pulmonary/Chest: Effort normal and breath sounds normal. No stridor. No respiratory distress. She has no rhonchi.  Musculoskeletal: Normal range of motion.  Lymphadenopathy:    She has cervical adenopathy.  Neurological: She is alert and oriented to person, place, and time.  Skin: Skin is warm and dry.  Psychiatric: She has a normal mood and affect. Her behavior is normal.  Nursing note and vitals reviewed.    UC Treatments / Results  Labs (all labs ordered are listed, but only abnormal results are displayed) Labs Reviewed  EPSTEIN-BARR VIRUS EARLY D ANTIGEN ANTIBODY, IGG  EPSTEIN-BARR VIRUS NUCLEAR ANTIGEN ANTIBODY, IGG  EPSTEIN-BARR VIRUS VCA, IGG  EPSTEIN-BARR VIRUS VCA, IGM  POCT RAPID STREP A (OFFICE)  POCT MONO SCREEN ([facility])  POCT CBC W AUTO DIFF ([facility])    EKG None  Radiology No results found.  Procedures Procedures (including critical care time)  Medications Ordered in UC Medications - No data to display  Initial Impression / Assessment and Plan / UC Course  I have reviewed the triage vital signs and the nursing notes.  Pertinent labs & imaging results that were available during my care of the patient were reviewed by me and considered in my medical decision making (see chart for details).     Rapid strep and mono- NEGATIVE CBC: unremarkable  Mono antibody tests sent to lab If send out mono test negative, encouraged f/u with PCP and ENT   Final Clinical Impressions(s) / UC Diagnoses   Final diagnoses:  Recurrent tonsillitis  Tachycardia     Discharge Instructions      You will be notified of the  rest of today's lab results when they come within the next 1 week.   In the meantime, you may take Tylenol and Motrin for any pain.  Please follow up with family medicine and with an ear nose throat (ENT) specialist for further evaluation of recurrent tonsillitis.     ED Prescriptions    None     Controlled  Substance Prescriptions [place] Controlled Substance Registry consulted? Not Applicable   Rolla Platehelps, Bobette Leyh O, PA-C 08/31/18 1753

## 2018-09-01 LAB — EPSTEIN-BARR VIRUS NUCLEAR ANTIGEN ANTIBODY, IGG: EBV NA IgG: 108 U/mL — ABNORMAL HIGH

## 2018-09-01 LAB — EPSTEIN-BARR VIRUS VCA, IGM: EBV VCA IgM: <36 U/mL

## 2018-09-01 LAB — EPSTEIN-BARR VIRUS EARLY D ANTIGEN ANTIBODY, IGG: EBV EA IgG: <9 U/mL

## 2018-09-01 LAB — EPSTEIN-BARR VIRUS VCA, IGG: EBV VCA IgG: 250 U/mL — ABNORMAL HIGH

## 2018-09-02 ENCOUNTER — Telehealth: Payer: Self-pay | Admitting: Emergency Medicine

## 2018-09-02 NOTE — Telephone Encounter (Signed)
Still has sore throat, labs show past mono infection as per Dr Cathren HarshBeese. I advised her to see ENT for recurrent tonsillitis.

## 2022-02-26 ENCOUNTER — Emergency Department (INDEPENDENT_AMBULATORY_CARE_PROVIDER_SITE_OTHER)
Admission: EM | Admit: 2022-02-26 | Discharge: 2022-02-26 | Disposition: A | Discharge status: Home / Self Care | Payer: Self-pay | Source: Home / Self Care

## 2022-02-26 DIAGNOSIS — J029 Acute pharyngitis, unspecified: Secondary | ICD-10-CM

## 2022-02-26 LAB — POCT RAPID STREP A (OFFICE): Rapid Strep A Screen: NEGATIVE

## 2022-02-26 NOTE — ED Provider Notes (Signed)
[facility]    CSN: [id number] Arrival date & time: 02/26/22  1106      History   Chief Complaint Chief Complaint  Patient presents with   Sore Throat   Neck Pain    HPI Allison Garrett is a 41 y.o. female.   HPI 41 year old female presents with sore throat and left-sided neck pain that began on Saturday.  Patient reports a history of increased heart rate while in doctor's office or hospital.  Patient declined waiting pulse ox on during triage.  PMH significant for anemia and anxiety. Patient is accompanied by her husband today.  Past Medical History:  Diagnosis Date   Anemia    Anxiety    Blood transfusion without reported diagnosis    4 units after first delivery   Depression     Patient Active Problem List   Diagnosis Date Noted   Vaginal delivery - waterbirth  11/03/2013   GBS (group B Streptococcus carrier), +RV culture, currently pregnant 11/03/2013    Past Surgical History:  Procedure Laterality Date   NO PAST SURGERIES      OB History     Gravida  2   Para  2   Term  2   Preterm      AB      Living  2      SAB      IAB      Ectopic      Multiple      Live Births  1            Home Medications    Prior to Admission medications   Medication Sig Start Date End Date Taking? Authorizing Provider  escitalopram (LEXAPRO) 5 MG tablet Take 5 mg by mouth daily.    [provider]  Prenatal Vit-Fe Fumarate-FA (PRENATAL MULTIVITAMIN) TABS tablet Take 1 tablet by mouth daily at 12 noon. Patient not taking: Reported on 02/26/2022    [provider]    Family History History reviewed. No pertinent family history.  Social History Social History   Tobacco Use   Smoking status: Never   Smokeless tobacco: Never  Substance Use Topics   Alcohol use: No   Drug use: No     Allergies   Patient has no known allergies.   Review of Systems Review of Systems  HENT:  Positive for sore throat.    Musculoskeletal:  Positive for neck pain.  All other systems reviewed and are negative.   Physical Exam Triage Vital Signs ED Triage Vitals  Enc Vitals Group     BP 02/26/22 1121 (!) 141/86     Pulse Rate 02/26/22 1121 (!) 150     Resp 02/26/22 1121 16     Temp 02/26/22 1121 98.5 F (36.9 C)     Temp Source 02/26/22 1121 Oral     SpO2 02/26/22 1121 99 %     Weight --      Height --      Head Circumference --      Peak Flow --      Pain Score 02/26/22 1124 3     Pain Loc --      Pain Edu? --      Excl. in GC? --    No data found.  Updated Vital Signs BP (!) 141/86 (BP Location: Right Arm)   Pulse (!) 150   Temp 98.5 F (36.9 C) (Oral)   Resp 16   SpO2 99%  Physical Exam Vitals and nursing note reviewed.  Constitutional:      Appearance: Normal appearance. She is well-developed and normal weight.  HENT:     Head: Normocephalic and atraumatic.     Right Ear: Tympanic membrane, ear canal and external ear normal.     Left Ear: Tympanic membrane, ear canal and external ear normal.     Mouth/Throat:     Mouth: Mucous membranes are moist.     Pharynx: Oropharynx is clear. Uvula midline.  Eyes:     Conjunctiva/sclera: Conjunctivae normal.     Pupils: Pupils are equal, round, and reactive to light.  Cardiovascular:     Rate and Rhythm: Regular rhythm. Tachycardia present.     Pulses: Normal pulses.     Heart sounds: Normal heart sounds. No murmur heard. Pulmonary:     Effort: Pulmonary effort is normal.     Breath sounds: Normal breath sounds. No wheezing, rhonchi or rales.  Musculoskeletal:     Cervical back: Normal range of motion and neck supple.  Skin:    General: Skin is warm and dry.  Neurological:     Mental Status: She is alert and oriented to person, place, and time.     UC Treatments / Results  Labs (all labs ordered are listed, but only abnormal results are displayed) Labs Reviewed  CULTURE, GROUP A STREP  POCT RAPID STREP A (OFFICE)     EKG   Radiology No results found.  Procedures Procedures (including critical care time)  Medications Ordered in UC Medications - No data to display  Initial Impression / Assessment and Plan / UC Course  I have reviewed the triage vital signs and the nursing notes.  Pertinent labs & imaging results that were available during my care of the patient were reviewed by me and considered in my medical decision making (see chart for details).     MDM: 1.  Sore throat-Advised rapid strep was negative, Encouraged patient to increase daily water intake. Advised we will follow-up with throat culture results once received. Final Clinical Impressions(s) / UC Diagnoses   Final diagnoses:  Sore throat     Discharge Instructions      Encouraged patient to increase daily water intake. Advised we will follow-up with throat culture results once received.     ED Prescriptions   None    PDMP not reviewed this encounter.   Trevor Iha, FNP 02/26/22 1202

## 2022-02-26 NOTE — ED Triage Notes (Signed)
Pt presents with sore throat and left side neck pain that began on Saturday. Pt states she has hx of increased HR at the doctors office/hospital. Pt states her HR at home is around 80 and when in the doctors she will increase to around 150. Pt has been cleared by an ED and cardiologist. Pt declined leaving pulse on on during triage.

## 2022-02-26 NOTE — Discharge Instructions (Addendum)
Encouraged patient to increase daily water intake. Advised we will follow-up with throat culture results once received.

## 2022-03-01 LAB — CULTURE, GROUP A STREP: Strep A Culture: NEGATIVE

## 2022-10-26 DIAGNOSIS — R002 Palpitations: Secondary | ICD-10-CM | POA: Diagnosis not present

## 2022-10-26 DIAGNOSIS — F411 Generalized anxiety disorder: Secondary | ICD-10-CM | POA: Diagnosis not present

## 2022-11-05 DIAGNOSIS — F411 Generalized anxiety disorder: Secondary | ICD-10-CM | POA: Diagnosis not present

## 2022-11-23 DIAGNOSIS — R002 Palpitations: Secondary | ICD-10-CM | POA: Diagnosis not present

## 2022-11-27 DIAGNOSIS — R002 Palpitations: Secondary | ICD-10-CM | POA: Diagnosis not present

## 2022-11-29 DIAGNOSIS — F411 Generalized anxiety disorder: Secondary | ICD-10-CM | POA: Diagnosis not present

## 2022-11-29 DIAGNOSIS — R799 Abnormal finding of blood chemistry, unspecified: Secondary | ICD-10-CM | POA: Diagnosis not present

## 2022-12-13 DIAGNOSIS — R002 Palpitations: Secondary | ICD-10-CM | POA: Diagnosis not present

## 2022-12-17 DIAGNOSIS — R002 Palpitations: Secondary | ICD-10-CM | POA: Diagnosis not present

## 2023-01-08 DIAGNOSIS — D696 Thrombocytopenia, unspecified: Secondary | ICD-10-CM | POA: Diagnosis not present

## 2023-01-10 DIAGNOSIS — F32A Depression, unspecified: Secondary | ICD-10-CM | POA: Diagnosis not present

## 2023-01-10 DIAGNOSIS — F419 Anxiety disorder, unspecified: Secondary | ICD-10-CM | POA: Diagnosis not present

## 2023-01-10 DIAGNOSIS — R002 Palpitations: Secondary | ICD-10-CM | POA: Diagnosis not present

## 2023-01-24 DIAGNOSIS — D696 Thrombocytopenia, unspecified: Secondary | ICD-10-CM | POA: Diagnosis not present

## 2023-01-24 DIAGNOSIS — K760 Fatty (change of) liver, not elsewhere classified: Secondary | ICD-10-CM | POA: Diagnosis not present

## 2023-01-31 DIAGNOSIS — F411 Generalized anxiety disorder: Secondary | ICD-10-CM | POA: Diagnosis not present

## 2023-05-13 DIAGNOSIS — F411 Generalized anxiety disorder: Secondary | ICD-10-CM | POA: Diagnosis not present

## 2023-05-20 DIAGNOSIS — F411 Generalized anxiety disorder: Secondary | ICD-10-CM | POA: Diagnosis not present

## 2023-08-20 DIAGNOSIS — F411 Generalized anxiety disorder: Secondary | ICD-10-CM | POA: Diagnosis not present

## 2023-08-22 ENCOUNTER — Encounter: Payer: Self-pay | Admitting: Emergency Medicine

## 2023-08-22 ENCOUNTER — Ambulatory Visit
Admission: EM | Admit: 2023-08-22 | Discharge: 2023-08-22 | Disposition: A | Discharge status: Home / Self Care | Payer: BC Managed Care – PPO

## 2023-08-22 DIAGNOSIS — J309 Allergic rhinitis, unspecified: Secondary | ICD-10-CM

## 2023-08-22 DIAGNOSIS — R509 Fever, unspecified: Secondary | ICD-10-CM | POA: Diagnosis not present

## 2023-08-22 DIAGNOSIS — R059 Cough, unspecified: Secondary | ICD-10-CM

## 2023-08-22 LAB — POCT INFLUENZA A/B
Influenza A, POC: NEGATIVE
Influenza B, POC: NEGATIVE

## 2023-08-22 LAB — POCT RAPID STREP A (OFFICE): Rapid Strep A Screen: NEGATIVE

## 2023-08-22 LAB — POC SARS CORONAVIRUS 2 AG -  ED: SARS Coronavirus 2 Ag: NEGATIVE

## 2023-08-22 MED ORDER — AZITHROMYCIN 250 MG PO TABS: 250.0000 mg | ORAL_TABLET | Freq: Every day | ORAL | 0 refills | Status: DC

## 2023-08-22 MED ORDER — BENZONATATE 200 MG PO CAPS: 200.0000 mg | ORAL_CAPSULE | Freq: Three times a day (TID) | ORAL | 0 refills | Status: AC | PRN

## 2023-08-22 MED ORDER — PREDNISONE 20 MG PO TABS: ORAL_TABLET | ORAL | 0 refills | Status: DC

## 2023-08-22 MED ORDER — FEXOFENADINE HCL 180 MG PO TABS: 180.0000 mg | ORAL_TABLET | Freq: Every day | ORAL | 0 refills | Status: DC

## 2023-08-22 NOTE — ED Triage Notes (Signed)
Patient c/o cough, body aches, difficulty breathing, low grade fever x 3 days.  Patient has taken Mucinex and Ibuprofen.

## 2023-08-22 NOTE — Discharge Instructions (Addendum)
Advised patient may take OTC Tylenol 1 g every 6 hours for fever (oral temperature greater than 100.3).  Advised may take Tessalon capsules daily or as needed for cough.  Advised may take Allegra daily x 5 days for concurrent postnasal drainage/drip.  Encouraged to increase daily water intake to 64 ounces per day while taking these medications.  Advised patient not to start Zithromax unless cough has been present for 7 to 10 days and no fever is accompanying cough.  Advised if symptoms worsen and/or unresolved please follow-up with your PCP or here for further evaluation.

## 2023-08-22 NOTE — ED Provider Notes (Signed)
[name]    CSN: [id number] Arrival date & time: 08/22/23  1857      History   Chief Complaint Chief Complaint  Patient presents with   Cough    HPI Allison Garrett is a 42 y.o. female.   HPI-year-old female presents with cough for 3 days.  PMH significant for obesity, anxiety and depression.  Past Medical History:  Diagnosis Date   Anemia    Anxiety    Blood transfusion without reported diagnosis    4 units after first delivery   Depression     Patient Active Problem List   Diagnosis Date Noted   Vaginal delivery - waterbirth  11/03/2013   GBS (group B Streptococcus carrier), +RV culture, currently pregnant 11/03/2013    Past Surgical History:  Procedure Laterality Date   NO PAST SURGERIES      OB History     Gravida  2   Para  2   Term  2   Preterm      AB      Living  2      SAB      IAB      Ectopic      Multiple      Live Births  1            Home Medications    Prior to Admission medications   Medication Sig Start Date End Date Taking? Authorizing Provider  azithromycin (ZITHROMAX) 250 MG tablet Take 1 tablet (250 mg total) by mouth daily. Take first 2 tablets together, then 1 every day until finished. 08/22/23  Yes Trevor Iha, FNP  benzonatate (TESSALON) 200 MG capsule Take 1 capsule (200 mg total) by mouth 3 (three) times daily as needed for up to 7 days. 08/22/23 08/29/23 Yes Trevor Iha, FNP  fexofenadine [facility] ALLERGY) 180 MG tablet Take 1 tablet (180 mg total) by mouth daily for 15 days. 08/22/23 09/06/23 Yes Trevor Iha, FNP  FLUoxetine (PROZAC) 10 MG capsule Take 10 mg by mouth daily.   Yes [provider]  predniSONE (DELTASONE) 20 MG tablet Take 3 tabs PO daily x 5 days. 08/22/23  Yes Trevor Iha, FNP  escitalopram (LEXAPRO) 5 MG tablet Take 5 mg by mouth daily.    [provider]  Prenatal Vit-Fe Fumarate-FA (PRENATAL MULTIVITAMIN) TABS tablet Take 1 tablet by mouth  daily at 12 noon. Patient not taking: Reported on 02/26/2022    [provider]    Family History History reviewed. No pertinent family history.  Social History Social History   Tobacco Use   Smoking status: Never   Smokeless tobacco: Never  Vaping Use   Vaping status: Never Used  Substance Use Topics   Alcohol use: No   Drug use: No     Allergies   Amoxicillin   Review of Systems Review of Systems  Constitutional:  Positive for fever.  HENT:  Positive for congestion.   Respiratory:  Positive for cough.   Musculoskeletal:  Positive for arthralgias and myalgias.  All other systems reviewed and are negative.    Physical Exam Triage Vital Signs ED Triage Vitals  Encounter Vitals Group     BP      Systolic BP Percentile      Diastolic BP Percentile      Pulse      Resp      Temp      Temp src      SpO2  Weight      Height      Head Circumference      Peak Flow      Pain Score      Pain Loc      Pain Education      Exclude from Growth Chart    No data found.  Updated Vital Signs BP (!) 143/82 (BP Location: Left Arm)   Pulse (!) 133   Temp (!) 100.7 F (38.2 C) (Oral)   Resp 18   Ht 5\' 2"  (1.575 m)   Wt 155 lb (70.3 kg)   LMP 08/08/2023   SpO2 100%   Breastfeeding No   BMI 28.35 kg/m    Physical Exam Vitals and nursing note reviewed.  Constitutional:      General: She is not in acute distress.    Appearance: Normal appearance. She is obese. She is not ill-appearing.  HENT:     Head: Normocephalic and atraumatic.     Right Ear: Tympanic membrane, ear canal and external ear normal.     Left Ear: Tympanic membrane, ear canal and external ear normal.     Mouth/Throat:     Mouth: Mucous membranes are moist.     Pharynx: Oropharynx is clear.     Comments: Moderate amount of clear drainage of posterior oropharynx noted Eyes:     Extraocular Movements: Extraocular movements intact.     Conjunctiva/sclera: Conjunctivae normal.      Pupils: Pupils are equal, round, and reactive to light.  Cardiovascular:     Rate and Rhythm: Normal rate and regular rhythm.     Pulses: Normal pulses.     Heart sounds: Normal heart sounds.  Pulmonary:     Effort: Pulmonary effort is normal.     Breath sounds: Normal breath sounds. No wheezing, rhonchi or rales.     Comments: Infrequent nonproductive cough noted on exam Musculoskeletal:        General: Normal range of motion.     Cervical back: Normal range of motion and neck supple.  Skin:    General: Skin is warm and dry.  Neurological:     General: No focal deficit present.     Mental Status: She is alert and oriented to person, place, and time. Mental status is at baseline.  Psychiatric:        Mood and Affect: Mood normal.        Behavior: Behavior normal.      UC Treatments / Results  Labs (all labs ordered are listed, but only abnormal results are displayed) Labs Reviewed  POC SARS CORONAVIRUS 2 AG -  ED  POCT INFLUENZA A/B  POCT RAPID STREP A (OFFICE)    EKG   Radiology No results found.  Procedures Procedures (including critical care time)  Medications Ordered in UC Medications - No data to display  Initial Impression / Assessment and Plan / UC Course  I have reviewed the triage vital signs and the nursing notes.  Pertinent labs & imaging results that were available during my care of the patient were reviewed by me and considered in my medical decision making (see chart for details).     MDM: 1.  Fever, unspecified-Advised patient may take OTC Tylenol 1 g every 6 hours for fever (oral temperature greater than 100.3). 2.  Cough, unspecified type-asked Tessalon 200 g capsules: Take 1 capsule 3 times daily, as needed for cough; 3.  Allergic rhinitis, unspecified seasonality, unspecified trigger-Allegra 180 mg fexofenadine daily x 5  days advised may take Tessalon capsules daily or as needed for cough.  Advised may take Allegra daily x 5 days for concurrent  postnasal drainage/drip.  Encouraged to increase daily water intake to 64 ounces per day while taking these medications.  Advised patient not to start Zithromax (patient request this medication be prescribed to her and will use after fever has resolved per patient) unless cough has been present for 7 to 10 days and no fever is accompanying cough.  Advised if symptoms worsen and/or unresolved please follow-up with your PCP or here for further evaluation. Final Clinical Impressions(s) / UC Diagnoses   Final diagnoses:  Cough, unspecified type  Fever, unspecified  Allergic rhinitis, unspecified seasonality, unspecified trigger     Discharge Instructions      Advised patient may take OTC Tylenol 1 g every 6 hours for fever (oral temperature greater than 100.3).  Advised may take Tessalon capsules daily or as needed for cough.  Advised may take Allegra daily x 5 days for concurrent postnasal drainage/drip.  Encouraged to increase daily water intake to 64 ounces per day while taking these medications.  Advised patient not to start Zithromax unless cough has been present for 7 to 10 days and no fever is accompanying cough.  Advised if symptoms worsen and/or unresolved please follow-up with your PCP or here for further evaluation.     ED Prescriptions     Medication Sig Dispense Auth. Provider   benzonatate (TESSALON) 200 MG capsule Take 1 capsule (200 mg total) by mouth 3 (three) times daily as needed for up to 7 days. 40 capsule Trevor Iha, FNP   predniSONE (DELTASONE) 20 MG tablet Take 3 tabs PO daily x 5 days. 15 tablet Trevor Iha, FNP   azithromycin (ZITHROMAX) 250 MG tablet Take 1 tablet (250 mg total) by mouth daily. Take first 2 tablets together, then 1 every day until finished. 6 tablet Trevor Iha, FNP   fexofenadine [facility] ALLERGY) 180 MG tablet Take 1 tablet (180 mg total) by mouth daily for 15 days. 15 tablet Trevor Iha, FNP      PDMP not reviewed this encounter.    Trevor Iha, FNP 08/22/23 2008

## 2023-09-02 ENCOUNTER — Ambulatory Visit
Admission: RE | Admit: 2023-09-02 | Discharge: 2023-09-02 | Disposition: A | Discharge status: Home / Self Care | Payer: BC Managed Care – PPO | Source: Ambulatory Visit | Attending: Family Medicine | Admitting: Family Medicine

## 2023-09-02 ENCOUNTER — Other Ambulatory Visit: Payer: Self-pay

## 2023-09-02 VITALS — BP 125/86 | HR 124 | Temp 98.2°F | Resp 16

## 2023-09-02 DIAGNOSIS — J069 Acute upper respiratory infection, unspecified: Secondary | ICD-10-CM | POA: Diagnosis not present

## 2023-09-02 MED ORDER — DOXYCYCLINE HYCLATE 100 MG PO CAPS: 100.0000 mg | ORAL_CAPSULE | Freq: Two times a day (BID) | ORAL | 0 refills | Status: AC

## 2023-09-02 NOTE — ED Provider Notes (Signed)
[name]    CSN: [id number] Arrival date & time: 09/02/23  1015      History   Chief Complaint Chief Complaint  Patient presents with   Cough    HPI Allison Garrett is a 42 y.o. female.   HPI  Patient states that she was seen last week for upper respiratory infection.  She was told she likely had a viral bronchitis.  She was given a prescription for Z-Pak to take if she felt she did not improve.  She read things about azithromycin and realizes it can prolong QT interval especially with fluoxetine.  She took 1 pill.  She is worried that it can affect her heart.  She has a history of tachycardia.  She did not take any additional azithromycin.  She states that she did feel better in 1 day she took the azithromycin.  She is here because she continues to cough.  She feels she needs another antibiotic but is worried about taking azithromycin  I told the patient that in my years of medical practice I had not seen azithromycin cause QT prolongation and healthy young people taking 10 mg of Prozac a day.  I told her that I would have prescribed this medication myself and that I feel like it is safe.  In any event, we will change antibiotics because she has a concern  Past Medical History:  Diagnosis Date   Anemia    Anxiety    Blood transfusion without reported diagnosis    4 units after first delivery   Depression     Patient Active Problem List   Diagnosis Date Noted   Vaginal delivery - waterbirth  11/03/2013   GBS (group B Streptococcus carrier), +RV culture, currently pregnant 11/03/2013    Past Surgical History:  Procedure Laterality Date   NO PAST SURGERIES      OB History     Gravida  2   Para  2   Term  2   Preterm      AB      Living  2      SAB      IAB      Ectopic      Multiple      Live Births  1            Home Medications    Prior to Admission medications   Medication Sig Start Date End Date Taking? Authorizing  Provider  ascorbic acid (VITAMIN C) 500 MG tablet Take 500 mg by mouth daily.   Yes [provider]  cholecalciferol (VITAMIN D3) 25 MCG (1000 UNIT) tablet Take 1,000 Units by mouth daily.   Yes [provider]  doxycycline (VIBRAMYCIN) 100 MG capsule Take 1 capsule (100 mg total) by mouth 2 (two) times daily. 09/02/23  Yes Eustace Moore, MD  FLUoxetine (PROZAC) 10 MG capsule Take 10 mg by mouth daily.    [provider]    Family History History reviewed. No pertinent family history.  Social History Social History   Tobacco Use   Smoking status: Never   Smokeless tobacco: Never  Vaping Use   Vaping status: Never Used  Substance Use Topics   Alcohol use: No   Drug use: No     Allergies   Amoxicillin   Review of Systems Review of Systems See HPI  Physical Exam Triage Vital Signs ED Triage Vitals  Encounter Vitals Group     BP 09/02/23 1026 125/86  Systolic BP Percentile --      Diastolic BP Percentile --      Pulse Rate 09/02/23 1026 (!) 124     Resp 09/02/23 1026 16     Temp 09/02/23 1026 98.2 F (36.8 C)     Temp Source 09/02/23 1026 Oral     SpO2 09/02/23 1026 98 %     Weight --      Height --      Head Circumference --      Peak Flow --      Pain Score 09/02/23 1028 3     Pain Loc --      Pain Education --      Exclude from Growth Chart --    No data found.  Updated Vital Signs BP 125/86   Pulse (!) 124   Temp 98.2 F (36.8 C) (Oral)   Resp 16   LMP 08/08/2023   SpO2 98%       Physical Exam Constitutional:      General: She is not in acute distress.    Appearance: She is well-developed and normal weight.  HENT:     Head: Normocephalic and atraumatic.     Right Ear: Tympanic membrane and ear canal normal.     Left Ear: Tympanic membrane and ear canal normal.     Nose: Nose normal. No congestion.     Mouth/Throat:     Mouth: Mucous membranes are moist.     Pharynx: No posterior oropharyngeal erythema.      Comments: Voice is hoarse Eyes:     Conjunctiva/sclera: Conjunctivae normal.     Pupils: Pupils are equal, round, and reactive to light.  Cardiovascular:     Rate and Rhythm: Tachycardia present.  Pulmonary:     Effort: Pulmonary effort is normal. No respiratory distress.     Breath sounds: Normal breath sounds.  Abdominal:     General: There is no distension.     Palpations: Abdomen is soft.  Musculoskeletal:        General: Normal range of motion.     Cervical back: Normal range of motion.  Skin:    General: Skin is warm and dry.  Neurological:     General: No focal deficit present.     Mental Status: She is alert.      UC Treatments / Results  Labs (all labs ordered are listed, but only abnormal results are displayed) Labs Reviewed - No data to display  EKG   Radiology No results found.  Procedures Procedures (including critical care time)  Medications Ordered in UC Medications - No data to display  Initial Impression / Assessment and Plan / UC Course  I have reviewed the triage vital signs and the nursing notes.  Pertinent labs & imaging results that were available during my care of the patient were reviewed by me and considered in my medical decision making (see chart for details).     Final Clinical Impressions(s) / UC Diagnoses   Final diagnoses:  Viral URI with cough     Discharge Instructions      Continue to drink lots of water Use over-the-counter cough medicine as needed Run a humidifier Fill and take the doxycycline if you fail to improve Doxycycline does not have any cardiac effect It is important to take doxycycline with food   ED Prescriptions     Medication Sig Dispense Auth. Provider   doxycycline (VIBRAMYCIN) 100 MG capsule Take 1 capsule (100 mg total)  by mouth 2 (two) times daily. 14 capsule Eustace Moore, MD      PDMP not reviewed this encounter.   Eustace Moore, MD 09/02/23 [phone]

## 2023-09-02 NOTE — Discharge Instructions (Addendum)
Continue to drink lots of water Use over-the-counter cough medicine as needed Run a humidifier Fill and take the doxycycline if you fail to improve Doxycycline does not have any cardiac effect It is important to take doxycycline with food

## 2023-09-02 NOTE — ED Triage Notes (Addendum)
X a week and a half has had cough, initially had fever and aches. Has been taking mucinex. Reports she has anxiety which is why her hr is up. Reports was seen about a week and half ago and had negative covid and flu testing.

## 2023-11-14 DIAGNOSIS — F411 Generalized anxiety disorder: Secondary | ICD-10-CM | POA: Diagnosis not present

## 2023-12-05 DIAGNOSIS — K760 Fatty (change of) liver, not elsewhere classified: Secondary | ICD-10-CM | POA: Diagnosis not present

## 2024-02-05 DIAGNOSIS — F411 Generalized anxiety disorder: Secondary | ICD-10-CM | POA: Diagnosis not present

## 2024-02-06 DIAGNOSIS — F411 Generalized anxiety disorder: Secondary | ICD-10-CM | POA: Diagnosis not present

## 2024-03-24 DIAGNOSIS — F411 Generalized anxiety disorder: Secondary | ICD-10-CM | POA: Diagnosis not present

## 2024-05-06 DIAGNOSIS — F411 Generalized anxiety disorder: Secondary | ICD-10-CM | POA: Diagnosis not present
